# Patient Record
Sex: Male | Born: 1955 | Race: White | Hispanic: No | Marital: Married | Smoking: Former smoker
Health system: Southern US, Community
[De-identification: ages and names within clinical notes are randomized; demographics above are authoritative.]

## PROBLEM LIST (undated history)

## (undated) DIAGNOSIS — I209 Angina pectoris, unspecified: Secondary | ICD-10-CM

## (undated) DIAGNOSIS — F419 Anxiety disorder, unspecified: Secondary | ICD-10-CM

## (undated) DIAGNOSIS — Z21 Asymptomatic human immunodeficiency virus [HIV] infection status: Secondary | ICD-10-CM

## (undated) DIAGNOSIS — B20 Human immunodeficiency virus [HIV] disease: Secondary | ICD-10-CM

## (undated) DIAGNOSIS — A6002 Herpesviral infection of other male genital organs: Secondary | ICD-10-CM

## (undated) DIAGNOSIS — Z860100 Personal history of colon polyps, unspecified: Secondary | ICD-10-CM

## (undated) DIAGNOSIS — E7800 Pure hypercholesterolemia, unspecified: Secondary | ICD-10-CM

## (undated) DIAGNOSIS — J309 Allergic rhinitis, unspecified: Secondary | ICD-10-CM

## (undated) DIAGNOSIS — K5792 Diverticulitis of intestine, part unspecified, without perforation or abscess without bleeding: Secondary | ICD-10-CM

## (undated) DIAGNOSIS — E785 Hyperlipidemia, unspecified: Secondary | ICD-10-CM

## (undated) DIAGNOSIS — G629 Polyneuropathy, unspecified: Secondary | ICD-10-CM

## (undated) DIAGNOSIS — Z9889 Other specified postprocedural states: Secondary | ICD-10-CM

## (undated) DIAGNOSIS — I10 Essential (primary) hypertension: Secondary | ICD-10-CM

## (undated) DIAGNOSIS — G2581 Restless legs syndrome: Secondary | ICD-10-CM

## (undated) HISTORY — DX: Personal history of colonic polyps: Z86.010

## (undated) HISTORY — DX: Anxiety disorder, unspecified: F41.9

## (undated) HISTORY — DX: Hyperlipidemia, unspecified: E78.5

## (undated) HISTORY — DX: Pure hypercholesterolemia, unspecified: E78.00

## (undated) HISTORY — DX: Angina pectoris, unspecified: I20.9

## (undated) HISTORY — DX: Diverticulitis of intestine, part unspecified, without perforation or abscess without bleeding: K57.92

## (undated) HISTORY — DX: Herpesviral infection of other male genital organs: A60.02

## (undated) HISTORY — DX: Personal history of colon polyps, unspecified: Z86.0100

## (undated) HISTORY — DX: Allergic rhinitis, unspecified: J30.9

## (undated) HISTORY — DX: Human immunodeficiency virus (HIV) disease: B20

## (undated) HISTORY — DX: Restless legs syndrome: G25.81

## (undated) HISTORY — DX: Polyneuropathy, unspecified: G62.9

## (undated) HISTORY — DX: Other specified postprocedural states: Z98.890

## (undated) HISTORY — DX: Essential (primary) hypertension: I10

## (undated) HISTORY — DX: Asymptomatic human immunodeficiency virus (hiv) infection status: Z21

---

## 1998-06-16 ENCOUNTER — Emergency Department (HOSPITAL_COMMUNITY): Admission: EM | Admit: 1998-06-16 | Discharge: 1998-06-16 | Payer: Self-pay | Admitting: Emergency Medicine

## 2002-01-27 HISTORY — PX: OTHER SURGICAL HISTORY: SHX169

## 2003-09-07 ENCOUNTER — Ambulatory Visit (HOSPITAL_BASED_OUTPATIENT_CLINIC_OR_DEPARTMENT_OTHER): Admission: RE | Admit: 2003-09-07 | Discharge: 2003-09-07 | Payer: Self-pay | Admitting: Internal Medicine

## 2004-01-15 ENCOUNTER — Ambulatory Visit: Payer: Self-pay | Admitting: Internal Medicine

## 2004-03-19 ENCOUNTER — Ambulatory Visit: Payer: Self-pay | Admitting: Internal Medicine

## 2004-08-12 ENCOUNTER — Ambulatory Visit: Payer: Self-pay | Admitting: Endocrinology

## 2005-08-05 ENCOUNTER — Ambulatory Visit: Payer: Self-pay | Admitting: Family Medicine

## 2005-08-21 ENCOUNTER — Ambulatory Visit: Payer: Self-pay | Admitting: Family Medicine

## 2005-08-25 ENCOUNTER — Encounter: Admission: RE | Admit: 2005-08-25 | Discharge: 2005-08-25 | Payer: Self-pay | Admitting: Family Medicine

## 2005-08-28 ENCOUNTER — Ambulatory Visit: Payer: Self-pay | Admitting: Family Medicine

## 2005-09-09 ENCOUNTER — Ambulatory Visit: Payer: Self-pay | Admitting: Internal Medicine

## 2005-10-20 ENCOUNTER — Ambulatory Visit: Payer: Self-pay | Admitting: Internal Medicine

## 2006-01-06 ENCOUNTER — Ambulatory Visit: Payer: Self-pay | Admitting: Family Medicine

## 2006-02-27 ENCOUNTER — Ambulatory Visit: Payer: Self-pay | Admitting: Family Medicine

## 2006-03-06 ENCOUNTER — Ambulatory Visit: Payer: Self-pay | Admitting: Family Medicine

## 2006-08-17 ENCOUNTER — Ambulatory Visit: Payer: Self-pay | Admitting: Family Medicine

## 2006-09-15 ENCOUNTER — Ambulatory Visit: Payer: Self-pay | Admitting: Family Medicine

## 2007-06-10 ENCOUNTER — Ambulatory Visit: Payer: Self-pay | Admitting: Family Medicine

## 2007-09-06 ENCOUNTER — Ambulatory Visit: Payer: Self-pay | Admitting: Family Medicine

## 2007-10-23 ENCOUNTER — Emergency Department (HOSPITAL_COMMUNITY): Admission: EM | Admit: 2007-10-23 | Discharge: 2007-10-23 | Payer: Self-pay | Admitting: Emergency Medicine

## 2007-10-26 ENCOUNTER — Ambulatory Visit: Payer: Self-pay | Admitting: Family Medicine

## 2007-11-16 ENCOUNTER — Ambulatory Visit: Payer: Self-pay | Admitting: Family Medicine

## 2007-12-13 ENCOUNTER — Ambulatory Visit: Payer: Self-pay | Admitting: Family Medicine

## 2007-12-20 ENCOUNTER — Ambulatory Visit: Payer: Self-pay | Admitting: Family Medicine

## 2007-12-27 ENCOUNTER — Ambulatory Visit: Payer: Self-pay | Admitting: Family Medicine

## 2007-12-29 ENCOUNTER — Ambulatory Visit: Payer: Self-pay | Admitting: Internal Medicine

## 2007-12-29 ENCOUNTER — Ambulatory Visit: Payer: Self-pay | Admitting: Family Medicine

## 2008-01-06 ENCOUNTER — Ambulatory Visit: Payer: Self-pay | Admitting: Family Medicine

## 2008-03-01 ENCOUNTER — Ambulatory Visit: Payer: Self-pay | Admitting: Family Medicine

## 2008-05-29 ENCOUNTER — Ambulatory Visit: Payer: Self-pay | Admitting: Family Medicine

## 2008-06-27 ENCOUNTER — Ambulatory Visit: Payer: Self-pay | Admitting: Family Medicine

## 2008-07-24 ENCOUNTER — Ambulatory Visit: Payer: Self-pay | Admitting: Family Medicine

## 2008-08-24 ENCOUNTER — Ambulatory Visit: Payer: Self-pay | Admitting: Family Medicine

## 2008-09-04 ENCOUNTER — Ambulatory Visit: Payer: Self-pay | Admitting: Family Medicine

## 2008-10-17 ENCOUNTER — Ambulatory Visit: Payer: Self-pay | Admitting: Family Medicine

## 2008-10-19 ENCOUNTER — Ambulatory Visit: Payer: Self-pay | Admitting: Family Medicine

## 2008-10-30 ENCOUNTER — Ambulatory Visit: Payer: Self-pay | Admitting: Family Medicine

## 2008-10-31 ENCOUNTER — Emergency Department (HOSPITAL_COMMUNITY): Admission: EM | Admit: 2008-10-31 | Discharge: 2008-10-31 | Payer: Self-pay | Admitting: Emergency Medicine

## 2009-02-05 ENCOUNTER — Ambulatory Visit: Payer: Self-pay | Admitting: Family Medicine

## 2009-03-09 ENCOUNTER — Ambulatory Visit: Payer: Self-pay | Admitting: Family Medicine

## 2009-05-08 ENCOUNTER — Ambulatory Visit: Payer: Self-pay | Admitting: Family Medicine

## 2009-06-04 ENCOUNTER — Ambulatory Visit: Payer: Self-pay | Admitting: Family Medicine

## 2009-08-06 ENCOUNTER — Ambulatory Visit: Payer: Self-pay | Admitting: Family Medicine

## 2009-09-07 ENCOUNTER — Ambulatory Visit: Payer: Self-pay | Admitting: Family Medicine

## 2009-09-11 ENCOUNTER — Encounter (INDEPENDENT_AMBULATORY_CARE_PROVIDER_SITE_OTHER): Payer: Self-pay | Admitting: *Deleted

## 2009-10-04 ENCOUNTER — Ambulatory Visit (HOSPITAL_COMMUNITY)
Admission: RE | Admit: 2009-10-04 | Discharge: 2009-10-04 | Payer: Self-pay | Source: Home / Self Care | Admitting: Family Medicine

## 2009-10-04 ENCOUNTER — Ambulatory Visit: Payer: Self-pay | Admitting: Family Medicine

## 2009-10-05 ENCOUNTER — Ambulatory Visit: Payer: Self-pay | Admitting: Family Medicine

## 2009-10-09 ENCOUNTER — Encounter (INDEPENDENT_AMBULATORY_CARE_PROVIDER_SITE_OTHER): Payer: Self-pay | Admitting: *Deleted

## 2009-10-10 ENCOUNTER — Ambulatory Visit: Payer: Self-pay | Admitting: Internal Medicine

## 2009-11-16 ENCOUNTER — Ambulatory Visit: Payer: Self-pay | Admitting: Family Medicine

## 2009-11-26 HISTORY — PX: COLONOSCOPY: SHX174

## 2009-12-25 ENCOUNTER — Ambulatory Visit: Payer: Self-pay | Admitting: Family Medicine

## 2009-12-25 ENCOUNTER — Encounter: Admission: RE | Admit: 2009-12-25 | Discharge: 2009-12-25 | Payer: Self-pay | Admitting: Family Medicine

## 2010-01-01 ENCOUNTER — Ambulatory Visit: Payer: Self-pay | Admitting: Family Medicine

## 2010-01-10 ENCOUNTER — Ambulatory Visit: Payer: Self-pay | Admitting: Family Medicine

## 2010-01-11 ENCOUNTER — Encounter
Admission: RE | Admit: 2010-01-11 | Discharge: 2010-01-11 | Payer: Self-pay | Source: Home / Self Care | Attending: Family Medicine | Admitting: Family Medicine

## 2010-01-12 ENCOUNTER — Encounter
Admission: RE | Admit: 2010-01-12 | Discharge: 2010-01-12 | Payer: Self-pay | Source: Home / Self Care | Attending: Family Medicine | Admitting: Family Medicine

## 2010-02-26 NOTE — Miscellaneous (Signed)
Summary: [facility] Previsit/prep  Clinical Lists Changes  Medications: Added new medication of MOVIPREP 100 GM  SOLR (PEG-KCL-NACL-NASULF-NA ASC-C) As per prep instructions. - Signed Rx of MOVIPREP 100 GM  SOLR (PEG-KCL-NACL-NASULF-NA ASC-C) As per prep instructions.;  #1 x 0;  Signed;  Entered by: Wyona Almas RN;  Authorized by: Hilarie Fredrickson MD;  Method used: Electronically to [facility]  [phone]*, [address] [place], [place], [place]  [address], Ph: [phone] or [phone], Fax: [phone] Observations: Added new observation of ALLERGY REV: Done (10/10/2009 13:19)    Prescriptions: MOVIPREP 100 GM  SOLR (PEG-KCL-NACL-NASULF-NA ASC-C) As per prep instructions.  #1 x 0   Entered by:   Wyona Almas RN   Authorized by:   Hilarie Fredrickson MD   Signed by:   Wyona Almas RN on 10/10/2009   Method used:   Electronically to        [facility]  [phone]* (retail)       [address]       [place], [place]  [address]       Ph: [phone] or [phone]       Fax: [phone]   RxID:   [id number]

## 2010-02-26 NOTE — Letter (Signed)
Summary: Pre Visit Letter Revised  [facility]  [address] [place], [place] [address]   Phone: [phone]  Fax: [phone]    09/11/2009 MRN: [id number]  Joseph Johnston [address] [place], [place]  [address]              Procedure Date:  10-24-09    Welcome to the Gastroenterology Division at [facility].    You are scheduled to see a nurse for your pre-procedure visit on 10-10-09 at 1:30p.m. on the 3rd floor at [facility], [address]  We ask that you try to arrive at our office 15 minutes prior to your appointment time to allow for check-in.  Please take a minute to review the attached form.  If you answer "Yes" to one or more of the questions on the first page, we ask that you call the person listed at your earliest opportunity.  If you answer "No" to all of the questions, please complete the rest of the form and bring it to your appointment.    Your nurse visit will consist of discussing your medical and surgical history, your immediate family medical history, and your medications.    If you are unable to list all of your medications on the form, please bring the medication bottles to your appointment and we will list them.  We will need to be aware of both prescribed and over the counter drugs.  We will need to know exact dosage information as well.    Please be prepared to read and sign documents such as consent forms, a financial agreement, and acknowledgement forms.  If necessary, and with your consent, a friend or relative is welcome to sit-in on the nurse visit with you.  Please bring your insurance card so that we may make a copy of it.  If your insurance requires a referral to see a specialist, please bring your referral form from your primary care physician.  No co-pay is required for this nurse visit.     If you cannot keep your appointment, please call [phone] to cancel or reschedule prior to your appointment date.  This allows  [place] the opportunity to schedule an appointment for another patient in need of care.   Thank you for choosing [facility] for your medical needs.  We appreciate the opportunity to care for you.  Please visit [place] at our website  to learn more about our practice.                     Sincerely,  The Gastroenterology Division

## 2010-02-26 NOTE — Letter (Signed)
Summary: [facility] Instructions  Joseph Johnston  [address] [place], [place] [address]   Phone: [phone]  Fax: [phone]       Joseph Johnston    May 24, 1955    MRN: [id number]        Procedure Day Dorna Bloom:  [facility]  10/24/09     Arrival Time:  10:30AM     Procedure Time:  11:30AM     Location of Procedure:                    _ X_  [facility] (4th Floor)                       PREPARATION FOR COLONOSCOPY WITH MOVIPREP   Starting 5 days prior to your procedure 10/19/09 do not eat nuts, seeds, popcorn, corn, beans, peas,  salads, or any raw vegetables.  Do not take any fiber supplements (e.g. Metamucil, Citrucel, and Benefiber).  THE DAY BEFORE YOUR PROCEDURE         DATE: 10/23/09  DAY: TUESDAY  1.  Drink clear liquids the entire day-NO SOLID FOOD  2.  Do not drink anything colored red or purple.  Avoid juices with pulp.  No orange juice.  3.  Drink at least 64 oz. (8 glasses) of fluid/clear liquids during the day to prevent dehydration and help the prep work efficiently.  CLEAR LIQUIDS INCLUDE: Water Jello Ice Popsicles Tea (sugar ok, no milk/cream) Powdered fruit flavored drinks Coffee (sugar ok, no milk/cream) Gatorade Juice: apple, white grape, white cranberry  Lemonade Clear bullion, consomm, broth Carbonated beverages (any kind) Strained chicken noodle soup Hard Candy                             4.  In the morning, mix first dose of MoviPrep solution:    Empty 1 Pouch A and 1 Pouch B into the disposable container    Add lukewarm drinking water to the top line of the container. Mix to dissolve    Refrigerate (mixed solution should be used within 24 hrs)  5.  Begin drinking the prep at 5:00 p.m. The MoviPrep container is divided by 4 marks.   Every 15 minutes drink the solution down to the next mark (approximately 8 oz) until the full liter is complete.   6.  Follow completed prep with 16 oz of clear liquid of your choice  (Nothing red or purple).  Continue to drink clear liquids until bedtime.  7.  Before going to bed, mix second dose of MoviPrep solution:    Empty 1 Pouch A and 1 Pouch B into the disposable container    Add lukewarm drinking water to the top line of the container. Mix to dissolve    Refrigerate  THE DAY OF YOUR PROCEDURE      DATE: 10/24/09  DAY: WEDNESDAY  Beginning at 6:30AM (5 hours before procedure):         1. Every 15 minutes, drink the solution down to the next mark (approx 8 oz) until the full liter is complete.  2. Follow completed prep with 16 oz. of clear liquid of your choice.    3. You may drink clear liquids until 9:30AM (2 HOURS BEFORE PROCEDURE).   MEDICATION INSTRUCTIONS  Unless otherwise instructed, you should take regular prescription medications with a small sip of water   as early as possible the morning of  your procedure.          OTHER INSTRUCTIONS  You will need a responsible adult at least 53 years of age to accompany you and drive you home.   This person must remain in the waiting room during your procedure.  Wear loose fitting clothing that is easily removed.  Leave jewelry and other valuables at home.  However, you may wish to bring a book to read or  an iPod/MP3 player to listen to music as you wait for your procedure to start.  Remove all body piercing jewelry and leave at home.  Total time from sign-in until discharge is approximately 2-3 hours.  You should go home directly after your procedure and rest.  You can resume normal activities the  day after your procedure.  The day of your procedure you should not:   Drive   Make legal decisions   Operate machinery   Drink alcohol   Return to work  You will receive specific instructions about eating, activities and medications before you leave.    The above instructions have been reviewed and explained to me by   Wyona Almas RN  October 10, 2009 1:57 PM     I fully  understand and can verbalize these instructions _____________________________ Date _________

## 2010-03-21 ENCOUNTER — Ambulatory Visit (INDEPENDENT_AMBULATORY_CARE_PROVIDER_SITE_OTHER): Payer: BC Managed Care – PPO | Admitting: Family Medicine

## 2010-03-21 DIAGNOSIS — K219 Gastro-esophageal reflux disease without esophagitis: Secondary | ICD-10-CM

## 2010-03-21 DIAGNOSIS — M25519 Pain in unspecified shoulder: Secondary | ICD-10-CM

## 2010-04-08 ENCOUNTER — Ambulatory Visit (INDEPENDENT_AMBULATORY_CARE_PROVIDER_SITE_OTHER): Payer: BC Managed Care – PPO | Admitting: Family Medicine

## 2010-04-08 DIAGNOSIS — K120 Recurrent oral aphthae: Secondary | ICD-10-CM

## 2010-04-08 DIAGNOSIS — K219 Gastro-esophageal reflux disease without esophagitis: Secondary | ICD-10-CM

## 2010-04-08 DIAGNOSIS — R319 Hematuria, unspecified: Secondary | ICD-10-CM

## 2010-05-02 LAB — SEDIMENTATION RATE: Sed Rate: 4 mm/hr (ref 0–16)

## 2010-05-02 LAB — GC/CHLAMYDIA PROBE AMP, URINE
Chlamydia, Swab/Urine, PCR: NEGATIVE
GC Probe Amp, Urine: NEGATIVE

## 2010-05-02 LAB — EHRLICHIA ANTIBODY PANEL: E chaffeensis (HGE) Ab, IgM: <1:20 {titer}

## 2010-05-02 LAB — B. BURGDORFI ANTIBODIES: B burgdorferi Ab IgG+IgM: 0.07 {ISR}

## 2010-06-05 ENCOUNTER — Ambulatory Visit: Payer: BC Managed Care – PPO | Admitting: Family Medicine

## 2010-06-05 ENCOUNTER — Encounter: Payer: Self-pay | Admitting: Family Medicine

## 2010-06-05 ENCOUNTER — Ambulatory Visit (INDEPENDENT_AMBULATORY_CARE_PROVIDER_SITE_OTHER): Payer: BC Managed Care – PPO | Admitting: Family Medicine

## 2010-06-05 VITALS — BP 130/80 | HR 85 | Wt 220.0 lb

## 2010-06-05 DIAGNOSIS — N390 Urinary tract infection, site not specified: Secondary | ICD-10-CM

## 2010-06-05 LAB — POCT URINALYSIS DIPSTICK
Bilirubin, UA: NEGATIVE
Glucose, UA: NEGATIVE
Leukocytes, UA: NEGATIVE
Nitrite, UA: NEGATIVE

## 2010-06-05 MED ORDER — ROPINIROLE HCL 0.25 MG PO TABS: 0.2500 mg | ORAL_TABLET | Freq: Every day | ORAL | Status: DC

## 2010-06-05 MED ORDER — SULFAMETHOXAZOLE-TMP DS 800-160 MG PO TABS: 1.0000 | ORAL_TABLET | Freq: Two times a day (BID) | ORAL | Status: AC

## 2010-06-05 NOTE — Progress Notes (Signed)
  Subjective:    Patient ID: Joseph Johnston, male    DOB: 1955-10-11, 55 y.o.   MRN: [id number]  HPI he does state that the Nexium did get rid of his reflux symptoms however he has continued to have difficulty with abdominal bloating and gas like sensation. He cannot relate this to any particular foods. He's had no difficulty with nausea, vomiting, diarrhea. He does note that the gurgling and gas sensation gets worse with stress He also complains of a one-week history of discomfort in the perineal area and difficulty with urinating. He states that he gets started and then has to stop halfway through. No discharge or dysuria. Sexual activity was 3 or 4 weeks ago with his same sexual partner. He also notes that he has had more difficulty with RLS symptoms and would like to be placed on medication for that.   Review of Systems     Objective:   Physical Exam Abdominal exam shows decreased bowel sounds without masses or tenderness. Genitalia normal. Rectal exam shows a tender boggy prostate reproduces symptoms       Assessment & Plan:  Prostatitis. Probable IBS. RLS I will place him on Septra. I will also give Requip which she has had in the past.

## 2010-06-05 NOTE — Patient Instructions (Signed)
Take the antibiotic until it is done. If further difficulty please call. Let me know how the Requip works.

## 2010-06-14 NOTE — Procedures (Signed)
NAME:  Joseph Johnston, Joseph Johnston NO.:  [id number]   MEDICAL RECORD NO.:  [id number]          PATIENT TYPE:  OUT   LOCATION:  SLEEP CENTER                 FACILITY:  [facility]   PHYSICIAN:  Marcelyn Bruins, M.D. [facility] DATE OF BIRTH:  [birth year]   DATE OF ADMISSION:  09/07/2003  DATE OF DISCHARGE:  09/07/2003                              NOCTURNAL POLYSOMNOGRAM   REFERRING PHYSICIAN:  Corwin Levins, M.D.   INDICATION FOR THE STUDY:  Hypersomnia with sleep apnea.   SLEEP ARCHITECTURE:  The patient had a total sleep time of 362 minutes with  decreased REM and no slow wave sleep.  Sleep onset latency was mildly  prolonged at 32 minutes.  REM latency was quite prolonged at 273 minutes.   IMPRESSION:  1. Very mild obstructive sleep apnea hypopnea syndrome with oxygen     desaturation only as low 94%.  His obstructive events were positional,     nor were they related to REM.  2. Snoring noted throughout the study but was not quantified.  3. Premature ventricular contractions noted with a fair amount of frequency     during the study.  4. Large numbers of leg jerks with significant sleep disruption.  Clinical     correlation is suggested.                                   ______________________________                                Marcelyn Bruins, M.D. [facility]     KC/MEDQ  D:  09/19/2003 11:42:08  T:  09/20/2003 12:27:42  Job:  [id number]   cc:   Corwin Levins, M.D. [facility]

## 2010-06-14 NOTE — Assessment & Plan Note (Signed)
HEALTHCARE                               PULMONARY OFFICE NOTE   JEREMIYAH, CULLENS                      MRN:          [id number]  DATE:10/20/2005                            DOB:          [birth year]    PULMONARY FINAL FOLLOWUP:   HISTORY OF PRESENT ILLNESS:  This is a 55 year old white male, former  smoker, in for follow up evaluation of PFT'S and chest x-ray for evaluation  of unexplained dyspnea that occurs when he walks his dog, but not when he  does his treadmill.  He tells me he has not been doing his treadmill lately,  and has never pushed it to the level.  He was actually short of breath while  doing so.  He denies any exertional chest pain, orthopnea or nocturnal  symptoms at all.  He denies any cough, fevers, chills, sweats, hoarseness,  sinus or reflex symptoms.   He states he is still short of breath now walking the dogs, but this is  not really reproducible or directly proportionate to length or intensity of  exercise.   PHYSICAL EXAMINATION:  GENERAL:  He is a pleasant, ambulatory white male in  no acute distress.  VITAL SIGNS:  Stable.  Weight 228 pounds which is up almost 10 pounds from  previous visit (weight was 190 when he quit smoking in 2002).  Vital signs  are unremarkable.  HEENT:  Unremarkable.  Pharynx is clear.  LUNGS:  Lung fields perfectly clear bilaterally to auscultation and  percussion.  HEART:  Regular rhythm without murmurs, gallops, rubs.  ABDOMEN:  Soft, benign.  EXTREMITIES:  Warm without calf tenderness, cyanosis, clubbing or edema.   STUDIES:  Hemoglobin saturation 97% on room air.  Chest x-ray's and PFT's  were essentially normal.   IMPRESSION:  No evidence of an obvious pulmonary abnormality that would  explain paroxysms of dyspnea that are not directly related to activity.  The  only significant finding is one of progressive weight gain since he stopped  smoking to a level of 32 pounds extra now,  suggesting a possibility of  deconditioning.  That, and the fact that he says that even when he did the  treadmill he was really not working out aerobically indicated to me the  likelihood is that he has developed a combination of deconditioning and  anxiety related to his exercise tolerance.  To sort through the differential I therefore spent extra time with the  patient and going over the test that he had and recommended the following  approach:  Exercise level until he is short of breath but not out of breath,  30 minutes daily x2 weeks on the treadmill by adjusting the speed and  inclination.  If not convinced that he is having a significant training  benefit, the next step would be a CVST for which I have given the phone  number to schedule at his convenience.  Charlaine Dalton. Sherene Sires, MD, [facility]   MBW/MedQ  DD:  10/20/2005  DT:  10/22/2005  Job #:  [id number]   cc:   Sharlot Gowda, M.D.

## 2010-06-14 NOTE — Assessment & Plan Note (Signed)
[facility]                               PULMONARY OFFICE NOTE   Joseph Johnston, Joseph Johnston                      MRN:          [id number]  DATE:09/09/2005                            DOB:          1956-01-19    This is a pulmonary consultation requested by Dr. Susann Givens.   REASON FOR CONSULTATION:  Dyspnea.   HISTORY:  A 55 year old white male, who quit smoking in 2002 with no  respiratory complaints, then at a weight of 190 pounds.  He progressively  gained weight, up to about a weight of 250, with the onset of dyspnea within  the last two years, and now is losing weight voluntarily down to a weight of  212, but not getting better in terms of his dyspnea, which is now occurring  daily, not reproducible with activity but typically present at rest.  Despite complaints of dyspnea, he is able to get on a treadmill, and work  in the yard all day, but on the other hand gets short of breath just while  walking with his sister (apparently his sister is a trained athlete who does  marathons).   The patient denies any difficulty with sleeping, an associated cough, chest  pain, fevers, chills, sweats, orthopnea, PND, leg swelling or response to  beta agonists, which he says just make him feel shaky but don't help his  breathing.   PAST MEDICAL HISTORY:  Significant for hyperlipidemia.   ALLERGIES:  AMOXICILLIN causes rash.   SOCIAL HISTORY:  He quit smoking in 2002.   MEDICATIONS:  Include vitamins only.   FAMILY HISTORY:  Significant for emphysema in his father, who was recently  diagnosed with lung cancer and is a smoker.   REVIEW OF SYSTEMS:  Taken in detail on the work sheet, and significant for  the problems as outlined above.   PHYSICAL EXAMINATION:  GENERAL:  This is an anxious white male with typical  sigh of respirations, in no acute distress.  VITAL SIGNS:  Afebrile, normal vital signs.  HEENT:  Unremarkable.  Oropharynx is clear.  NECK:   Supple without cervical adenopathy or tenderness.  The trachea was  midline, no thyromegaly.  LUNGS:  Lung fields reveal diminished breath sounds bilaterally, no  wheezing.  CARDIAC:  There is a regular rhythm without murmur, gallop or rub.  No  increase in P2.  ABDOMEN:  Soft, benign.  EXTREMITIES:  Warm without calf tenderness, cyanosis, clubbing or edema.   LABORATORY DATA:  From Dr. Jola Babinski office included a bicarb level of 25,  normal TSH, normal CBC.   Chest x-ray is reported to be normal recently, but not available.   IMPRESSION:  Paroxysms of dyspnea that do not necessarily directly relate to  activity, except for the fact that he is short of breath when he walks with  his sister.  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  Most  likely this is functional in nature, but with asthma and chronic obstructive  pulmonary  disease being much less likely.   I have reviewed with the patient the concept of a manual versus automatic  respiratory rate control center, and asked him to try to stop focusing on  his breathing, then reassured him that if his exercise tolerance is good  with yard work and aerobic training on a treadmill, that he should be fine,  but that there should not be any serious COPD, or for that matter other lung  disease related to smoking (especially lung cancer, since his father has  just recently been diagnosed).  Hopefully this will reassure him.   In the meantime, I have scheduled him for a full set of PFTs, and I would  consider a CPST if the diagnosis remains in doubt, plus perhaps a  methacholine challenge test to be complete, but I do not believe either of  these will be necessary.                                   Charlaine Dalton. Sherene Sires, MD, [facility]   MBW/MedQ  DD:  09/09/2005  DT:  09/09/2005  Job #:  [id number]   cc:   Sharlot Gowda, MD

## 2010-09-13 ENCOUNTER — Telehealth: Payer: Self-pay | Admitting: Family Medicine

## 2010-09-13 MED ORDER — ALLOPURINOL 100 MG PO TABS: 100.0000 mg | ORAL_TABLET | Freq: Every day | ORAL | Status: DC

## 2010-09-13 NOTE — Telephone Encounter (Signed)
His insurance will not pay for urologist. He would like to be switched back to allopurinol. I will start him out at 100 mg and have him call me in one month. Cautioned him on gout attacks possibly occurring.

## 2010-09-13 NOTE — Telephone Encounter (Signed)
On uloric wants to switch to allopurinol for his gout  Please call pt

## 2010-09-18 ENCOUNTER — Other Ambulatory Visit: Payer: Self-pay

## 2010-09-18 MED ORDER — ALLOPURINOL 100 MG PO TABS: 100.0000 mg | ORAL_TABLET | Freq: Every day | ORAL | Status: DC

## 2010-09-18 NOTE — Telephone Encounter (Signed)
Pt called and said allopurinol didn't go through

## 2010-11-28 ENCOUNTER — Encounter: Payer: Self-pay | Admitting: Family Medicine

## 2010-11-28 ENCOUNTER — Ambulatory Visit (INDEPENDENT_AMBULATORY_CARE_PROVIDER_SITE_OTHER): Payer: BC Managed Care – PPO | Admitting: Family Medicine

## 2010-11-28 VITALS — BP 118/80 | HR 62 | Wt 221.0 lb

## 2010-11-28 DIAGNOSIS — Z79899 Other long term (current) drug therapy: Secondary | ICD-10-CM

## 2010-11-28 DIAGNOSIS — G2581 Restless legs syndrome: Secondary | ICD-10-CM

## 2010-11-28 DIAGNOSIS — Z8719 Personal history of other diseases of the digestive system: Secondary | ICD-10-CM

## 2010-11-28 DIAGNOSIS — G4730 Sleep apnea, unspecified: Secondary | ICD-10-CM

## 2010-11-28 DIAGNOSIS — Z209 Contact with and (suspected) exposure to unspecified communicable disease: Secondary | ICD-10-CM

## 2010-11-28 DIAGNOSIS — N400 Enlarged prostate without lower urinary tract symptoms: Secondary | ICD-10-CM

## 2010-11-28 LAB — CBC WITH DIFFERENTIAL/PLATELET
Basophils Relative: 0 % (ref 0–1)
Hemoglobin: 15.9 g/dL (ref 13.0–17.0)
Lymphs Abs: 4.4 10*3/uL — ABNORMAL HIGH (ref 0.7–4.0)
Monocytes Relative: 5 % (ref 3–12)
Neutro Abs: 4.4 10*3/uL (ref 1.7–7.7)
Neutrophils Relative %: 46 % (ref 43–77)
Platelets: 333 10*3/uL (ref 150–400)
RBC: 4.87 MIL/uL (ref 4.22–5.81)

## 2010-11-28 MED ORDER — TERAZOSIN HCL 1 MG PO CAPS: 1.0000 mg | ORAL_CAPSULE | Freq: Every day | ORAL | Status: DC

## 2010-11-28 NOTE — Progress Notes (Signed)
  Subjective:    Patient ID: Joseph Johnston, male    DOB: 06-Sep-1955, 55 y.o.   MRN: [id number]  HPI He is here for consult concerning difficulty with prostate. He is now noted difficulty with incomplete emptying and decreased stream. He had been placed on Hytrin in the past which helped however he stopped the medication thinking he did not need to continue this. He also has a history of ulcerative colitis and recently had a colonoscopy which did show an ulcerated lesion. He is on medication for this. He does have history of sleep apnea but is not interested in being on CPAP. He does have RLS and does occasionally use Requip.   Review of Systems     Objective:   Physical Exam Alert and in no distress otherwise not examined       Assessment & Plan:  BPH. Ulcerative colitis. Sleep apnea. RLS.  I will place him back on Hytrin. Also discusses ulcers colitis and appropriate followup on that. He will call me if he has any troubles. I recommended that he call me prior to having another colonoscopy which he states is supposed to be done in one year.

## 2010-11-29 LAB — COMPREHENSIVE METABOLIC PANEL
ALT: 27 U/L (ref 0–53)
Albumin: 5.2 g/dL (ref 3.5–5.2)
Alkaline Phosphatase: 75 U/L (ref 39–117)
CO2: 23 mEq/L (ref 19–32)
Glucose, Bld: 95 mg/dL (ref 70–99)
Potassium: 4.4 mEq/L (ref 3.5–5.3)
Sodium: 142 mEq/L (ref 135–145)
Total Protein: 8.1 g/dL (ref 6.0–8.3)

## 2010-11-29 LAB — LIPID PANEL
LDL Cholesterol: 187 mg/dL — ABNORMAL HIGH (ref 0–99)
Triglycerides: 198 mg/dL — ABNORMAL HIGH (ref <150)
VLDL: 40 mg/dL (ref 0–40)

## 2011-01-20 ENCOUNTER — Ambulatory Visit (INDEPENDENT_AMBULATORY_CARE_PROVIDER_SITE_OTHER): Payer: BC Managed Care – PPO | Admitting: Family Medicine

## 2011-01-20 ENCOUNTER — Encounter: Payer: Self-pay | Admitting: Family Medicine

## 2011-01-20 DIAGNOSIS — J019 Acute sinusitis, unspecified: Secondary | ICD-10-CM

## 2011-01-20 DIAGNOSIS — R509 Fever, unspecified: Secondary | ICD-10-CM

## 2011-01-20 DIAGNOSIS — R079 Chest pain, unspecified: Secondary | ICD-10-CM

## 2011-01-20 LAB — CBC WITH DIFFERENTIAL/PLATELET
Eosinophils Absolute: 0 10*3/uL (ref 0.0–0.7)
Hemoglobin: 15.7 g/dL (ref 13.0–17.0)
Lymphocytes Relative: 43 % (ref 12–46)
Lymphs Abs: 1.1 10*3/uL (ref 0.7–4.0)
MCH: 31.4 pg (ref 26.0–34.0)
Monocytes Relative: 7 % (ref 3–12)
Neutro Abs: 1.3 10*3/uL — ABNORMAL LOW (ref 1.7–7.7)
Neutrophils Relative %: 50 % (ref 43–77)
Platelets: 127 10*3/uL — ABNORMAL LOW (ref 150–400)
RBC: 5 MIL/uL (ref 4.22–5.81)
WBC: 2.6 10*3/uL — ABNORMAL LOW (ref 4.0–10.5)

## 2011-01-20 LAB — HIV ANTIBODY (ROUTINE TESTING W REFLEX): HIV: REACTIVE

## 2011-01-20 MED ORDER — AZITHROMYCIN 250 MG PO TABS: ORAL_TABLET | ORAL | Status: DC

## 2011-01-20 MED ORDER — ESOMEPRAZOLE MAGNESIUM 40 MG PO CPDR: 40.0000 mg | DELAYED_RELEASE_CAPSULE | Freq: Every day | ORAL | Status: DC

## 2011-01-20 NOTE — Progress Notes (Signed)
Last week started with having a "hard time breathing"--has discomfort/tightness in his chest, described as a knot that is twisting.  When he leans forward, it feels like reflux, like coming up chest.  Last week he only noticed these symptoms when leaning forward, but now it is constant.  There is associated nausea, but no vomiting.  Nausea is worse today.  Has been using Tums with temporary relief.    +discomfort in throat like post-nasal drip, causing very slight cough.  Yesterday had some head and sinus congestion, relieved by Tylenol Cold. +pain behind his eyes. +notes bad breath, even after brushing his teeth. Denies diarrhea. Feels like a lot of phlegm is in back of throat when he lies down.  Feels a little similar to when he was diagnosed with syphillis in the past. But now he is also having chills. Complaining of pain, being very sensitive to the touch all over his body, especially arms and legs, ie-hurts to take off his shirt.  These symptoms began last week, but getting worse.  Having some low back pain, but no other myalgias, but he did have a lot of muscle pain last week (like he was hit by a truck), but much improved now.  Running low grade fevers at home x 2 days (100.1-100.5).  Denies sick contacts.  Denies any exertional chest pain, shortness of breath. Stable monogamous relationship with male partner.  Would like RPR and HIV re-checked  Past Medical History  Diagnosis Date  . Dyslipidemia   . Gout   . Sleep apnea   . 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 Seborrheic dermatitis   . Ulcerative colitis     No past surgical history on file.  History   Social History  . Marital Status: Single    Spouse Name: N/A    Number of Children: N/A  . Years of Education: N/A   Occupational History  . landscaping    Social History Main Topics  . Smoking status: Former Smoker -- 0.0 packs/day for 30 years    Quit date: 01/28/2000  . Smokeless tobacco: Never Used  . Alcohol Use: No  . Drug  Use: No  . Sexually Active: Yes -- Male partner(s)   Other Topics Concern  . Not on file   Social History Narrative  . No narrative on file   Current outpatient prescriptions:allopurinol (ZYLOPRIM) 100 MG tablet, Take 1 tablet (100 mg total) by mouth daily., Disp: 30 tablet, Rfl: 2;  mesalamine (LIALDA) 1.2 G EC tablet, Take 1,200 mg by mouth 2 (two) times daily.  , Disp: , Rfl: ;  rOPINIRole (REQUIP) 0.25 MG tablet, Take 1 tablet (0.25 mg total) by mouth at bedtime., Disp: 30 tablet, Rfl: 12 terazosin (HYTRIN) 1 MG capsule, Take 1 capsule (1 mg total) by mouth at bedtime., Disp: 30 capsule, Rfl: 11  Allergies  Allergen Reactions  . Amoxicillin     REACTION: rash/hives  . Ampicillin     REACTION: Rash/hives  . Hydrocodone     REACTION: rash  . Oxycodone Hcl     REACTION: Rash  . Penicillins     REACTION: rash/hives   ROS:  See HPI. +fever, sinus congestion, chest pain, nausea, slight shortness of breath with leaning forward, +nausea, no diarrhea or skin rash  PHYSICAL EXAM: BP 130/92  Pulse 90  Temp(Src) 99 F (37.2 C) (Oral)  Resp 18  Wt 231 lb (104.781 kg)  SpO2 99% Well developed, pleasant male, in no distress.  Twice during  the visit he got slightly diaphoretic, felt lightheaded and needed to lie down (once during visit, and once after labs drawn) HEENT: PERRL, EOMI, conjunctiva clear.  TM's and EAC's normal.  Nasal mucosa moderately edematous with yellow-brown mucus.  Sinuses (maxillary and frontal) nontender.  OP clear Neck: no lymphadenopathy Heart: regular rate and rhythm without murmur Lungs: somewhat diminished breath sounds posteriorly bilaterally, normal anteriorly Skin: no rash Psych: normal mood, affect, hygiene and grooming  ASSESSMENT/PLAN:  1. Chest pain  PR ELECTROCARDIOGRAM, COMPLETE, DG Chest 2 View  2. Fever  CBC with Differential, RPR, HIV Antibody ( Reflex)  3. Sinusitis acute  azithromycin (ZITHROMAX) 250 MG tablet   History sounds like he may  have had flu, or flu-like illness, but now has evidence of bacterial sinus infection. He is having symptoms of reflux, but also some pleuritic chest pain.  EKG showed sinus bradycardia with incomplete RBBB, no acute abnormalities.  He was encouraged to drink plenty of fluids, continue decongestants, use Mucinex.  Given samples of Nexium to help with reflux symptoms.  He was asked to get CXR (especially if any shortness of breath, ongoing pleuritic chest pain)--orders in computer.  Will check CBC given presyncope, as well as HIV and RPR per patient request.  F/u if symptoms persist, worsen.

## 2011-01-20 NOTE — Patient Instructions (Addendum)
Continue decongestants.  Add Mucinex (or other expectorant) to help with chest congestion. Drink plenty of fluids.  Get chest x-ray if not improving with these measures.  Go to ER if worsening chest discomfort or shortness of breath  Use the Nexium for heartburn.  Try and eat small, frequent meals, and keep the head of bed elevated at night

## 2011-01-21 LAB — RPR

## 2011-01-24 ENCOUNTER — Encounter: Payer: Self-pay | Admitting: Medical

## 2011-01-24 ENCOUNTER — Ambulatory Visit (INDEPENDENT_AMBULATORY_CARE_PROVIDER_SITE_OTHER): Payer: BC Managed Care – PPO | Admitting: Medical

## 2011-01-24 VITALS — BP 120/80 | HR 100 | Temp 98.5°F | Resp 18 | Wt 230.0 lb

## 2011-01-24 DIAGNOSIS — B9789 Other viral agents as the cause of diseases classified elsewhere: Secondary | ICD-10-CM

## 2011-01-24 DIAGNOSIS — B349 Viral infection, unspecified: Secondary | ICD-10-CM | POA: Insufficient documentation

## 2011-01-24 DIAGNOSIS — R519 Headache, unspecified: Secondary | ICD-10-CM | POA: Insufficient documentation

## 2011-01-24 DIAGNOSIS — R0789 Other chest pain: Secondary | ICD-10-CM

## 2011-01-24 DIAGNOSIS — R51 Headache: Secondary | ICD-10-CM

## 2011-01-24 DIAGNOSIS — R52 Pain, unspecified: Secondary | ICD-10-CM

## 2011-01-24 NOTE — Patient Instructions (Signed)
For the headache and chest discomfort, use Ibuprofen OTC 200mg , 4 tablets every 6 hours with food.  Rest in a cool dark quiet room.  If the headache or chest discomfort become worse, call 911 or go to the Emergency Dept.  We will call just as soon as we get the remaining blood test back.    SYMPTOMS OF A HEART ATTACK The most common signs and symptoms include:   Tightness or squeezing in the chest.   Feeling of heaviness on the chest.   Discomfort in the arms, neck, or jaw.   Shortness of breath and nausea.   Cold, wet skin.   Chest pain or discomfort brought on by physical effort or excitement which increase the oxygen needs of the heart.   SEEK IMMEDIATE MEDICAL CARE IF:   You develop nausea, vomiting, or shortness of breath.   You feel faint, lightheaded, or pass out.   Your chest discomfort gets worse.   You are sweating or experience sudden profound fatigue.   Your discomfort lasts longer than 15 minutes.   If you have a history of heart disease or angina, you should tell your caregiver right away about any increase in the severity or frequency of your chest discomfort or angina attacks. When you have angina, you should stop what you are doing and sit down. This may bring relief in 3 to 5 minutes. If your caregiver has prescribed nitro, take it as directed.   WHAT IS A HEART ATTACK: Myocardial Infarction A myocardial infarction (MI) is damage to the heart that is not reversible. It is also called a heart attack. An MI usually occurs when a heart (coronary) artery becomes blocked or narrowed. This cuts off the blood supply to the heart. When one or more of the heart (coronary) arteries becomes blocked, that area of the heart begins to die. This causes pain felt during an MI.  If you think you might be having an MI, call your local emergency services immediately (911 in [place] It is recommended that you take a 162 mg non-enteric coated aspirin if you do not have an aspirin  allergy. Do not drive yourself to the hospital or wait to see if your symptoms go away. The sooner MI is treated, the greater the amount of heart muscle saved. Time is muscle. It can save your life. CAUSES  An MI can occur from:  A gradual buildup of a fatty substance called plaque. When plaque builds up in the arteries, this condition is called atherosclerosis. This buildup can block or reduce the blood supply to the heart artery(s).   A sudden plaque rupture within a heart artery that causes a blood clot (thrombus). A blood clot can block the heart artery which does not allow blood flow to the heart.   A severe tightening (spasm) of the heart artery. This is a less common cause of a heart attack. When a heart artery spasms, it cuts off blood flow through the artery. Spasms can occur in heart arteries that do not have atherosclerosis.  RISK FACTORS People at risk for an MI usually have one or more risk factors, such as:  High blood pressure.   High cholesterol.   Smoking.   Gender. Men have a higher heart attack risk.   Overweight/obesity.   Age.   Family history.   Lack of exercise.   Diabetes.   Stress.   Excessive alcohol use.   Street drug use (cocaine and methamphetamines).    Stroke (Cerebrovascular  Accident)  SIGNS AND SYMPTOMS OF STROKE These symptoms usually develop suddenly (or may be newly present upon awakening from sleep):  Sudden weakness or numbness of the face, arm, or leg, especially on one side of the body.   Sudden confusion.   Trouble speaking (aphasia) or understanding.   Sudden trouble seeing in one or both eyes.   Sudden trouble walking.   Dizziness.   Loss of balance or coordination.   Sudden severe headache with no known cause.   SEEK IMMEDIATE MEDICAL CARE IF:   You have sudden weakness or numbness of the face, arm, or leg, especially on one side of the body.   You have sudden confusion.   You have trouble speaking or  understanding.   You have sudden trouble seeing in one or both eyes.   You have sudden trouble walking.   You have dizziness.   You have a loss of balance or coordination.   You have a sudden severe headache with no known cause.   You have a fever.   You are coughing or have difficulty breathing.   You have new chest pain, angina, or an irregular heartbeat.  ANY OF THESE SYMPTOMS MAY REPRESENT A SERIOUS PROBLEM THAT IS AN EMERGENCY. Do not wait to see if the symptoms will go away. Get medical help right away. Call your local emergency services (911 in [place] DO NOT drive yourself to the hospital.  TREATMENT  TIME IS OF THE ESSENCE! It is important to seek treatment within 4 hours of the start of symptoms because you may receive a "clot dissolving" medication that cannot be given after that time. Even if you don't know when your symptoms began, get treatment as soon as possible.    WHAT IS A STROKE: A stroke is the sudden death of brain tissue. It is a medical emergency. A stroke can cause permanent loss of brain function. This can cause problems with different parts of your body. A TIA (transient ischemic attack) is different because it does not cause permanent damage. A TIA is a short-lived problem of poor blood flow affecting a part of the nervous system. TIA is also a serious problem because having a TIA greatly increases the chances of having a stroke. When symptoms first develop, you cannot know if the problem might be a stroke or TIA. CAUSES  A stroke is caused by a decrease of oxygen supply to an area of your brain. It is usually the result of a small blood clot or the arteries hardening. A stroke can also be caused by blocked or damaged carotid arteries. Bleeding in the brain can cause, or accompany, a stroke.  RISK FACTORS  High blood pressure (hypertension).   High cholesterol.   Diabetes.   Heart disease.   The buildup of fatty deposits in the blood vessels  (peripheral artery disease or atherosclerosis).   An abnormal heart rhythm (atrial fibrillation).   Obesity.   Smoking.   Taking oral contraceptives (especially in combination with smoking).   Physical inactivity.   A diet high in fats, salt (sodium), and calories.   Alcohol use.   Use of illegal drugs (especially cocaine and methamphetamine).   Being a male.   Being an Tree surgeon.   Age over 79.   Family history of stroke.   Previous history of blood clots, a "warning stroke" (transient ischemic attack, TIA), or heart attack.   Sickle cell disease.

## 2011-01-24 NOTE — Progress Notes (Signed)
Subjective:   HPI  Joseph Johnston is a 55 y.o. male who presents with f/u from visit earlier in the week.  He was seen this week for body aches, fever, chest tightness, and was though to have viral syndrome.  Labs were taken.  He is here to f/u on labs.  He still c/o bad headache, pain behind the eyes, chest still feels tight, fatigued, and is no worse or better.  Of note, he is in monogamous relationship with his male partner.  He has hx/o syphilis and says he feels like he did with prior syphilis infection. No prior positive HIV test.  He denies palpitations, no sweats, no vision or hearing changes, no numbness or tingling, no falls, no unilateral weakness.  He denies hx/o heart problems, but father died of CHF.  Father was diagnosed with heart disease in his 58s.  Sister has hx/o CVA in 21s.  No other aggravating or relieving factors.    No other c/o.  The following portions of the patient's history were reviewed and updated as appropriate: allergies, current medications, past family history, past medical history, past social history, past surgical history and problem list.  Past Medical History  Diagnosis Date  . Dyslipidemia   . Gout   . Sleep apnea   . 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 Seborrheic dermatitis   . Ulcerative colitis    Review of Systems Constitutional: -fever, -chills, -sweats, -unexpected -weight change, +fatigue ENT: -runny nose, -ear pain, -sore throat Cardiology:  -chest pain, -palpitations, -edema, +tightness in his chest Respiratory: -cough, +shortness of breath, -wheezing Gastroenterology: -abdominal pain, +nausea, -vomiting, -diarrhea, -constipation  Hematology: -bleeding or bruising problems Musculoskeletal: -arthralgias, +myalgias all over, -joint swelling, +back pain Ophthalmology: -vision changes,+dizzy  Urology: -dysuria, -difficulty urinating, -hematuria, -urinary frequency, -urgency Neurology: +headache, -weakness, -tingling, -numbness    Objective:   Physical Exam  Filed Vitals:   01/24/11 0805  BP: 120/80  Pulse: 100  Temp: 98.5 F (36.9 C)  Resp: 18    General appearance: alert, no distress, WD/WN, ill appearing Skin: slight macular rash on chest diffuse HEENT: normocephalic, sclerae anicteric, PERRLA, EOMi, nares with clear discharge and mild erythema, pharynx normal Oral cavity: MMM, no lesions Neck: supple, no lymphadenopathy, no thyromegaly, no masses Heart: RRR, normal S1, S2, no murmurs Lungs: CTA bilaterally, no wheezes, rhonchi, or rales Abdomen: +bs, soft, non tender, non distended, no masses, no hepatomegaly, no splenomegaly Back: non tender Extremities: no edema, no cyanosis, no clubbing Pulses: 2+ symmetric, upper and lower extremities, normal cap refill Neurological: alert, oriented x 3, CN2-12 intact, strength normal upper extremities and lower extremities, sensation normal throughout, DTRs 2+ throughout, no cerebellar signs, gait normal Psychiatric: normal affect, behavior normal, pleasant    Assessment and Plan :     Encounter Diagnoses  Name Primary?  . Headache Yes  . Chest tightness   . Body aches   . Viral syndrome    Reviewed his labs from earlier in the week.  His WBC were decreased, platelets decreased, RPR negative, but HIV antibody reactive.  We discussed his labs and +HIV antibody.  Western blot is still pending.  CXR with no obvious mass, pneumonia, effusion, or cardiomegaly, no acute changes.  Reviewed EKG from earlier in the week showing sinus bradycardia with incomplete RBBB, no acute abnormalities.  Advised that I can't completely rule out cardiac etiology, but etiology likely still viral.  He will use Ibuprofen for fever and headache, rest, will hydrate well, and if  worse headache or chest symptoms, will call 911 or go to the ED.  We will call with western blot result ASAP.

## 2011-01-28 ENCOUNTER — Other Ambulatory Visit: Payer: Self-pay | Admitting: Family Medicine

## 2011-01-29 LAB — HIV 1/2 CONFIRMATION
HIV-1 antibody: NEGATIVE
HIV-2 Ab: NEGATIVE

## 2011-01-30 ENCOUNTER — Ambulatory Visit (INDEPENDENT_AMBULATORY_CARE_PROVIDER_SITE_OTHER): Payer: BC Managed Care – PPO | Admitting: Family Medicine

## 2011-01-30 DIAGNOSIS — R51 Headache: Secondary | ICD-10-CM

## 2011-01-30 DIAGNOSIS — R0989 Other specified symptoms and signs involving the circulatory and respiratory systems: Secondary | ICD-10-CM

## 2011-01-30 DIAGNOSIS — R52 Pain, unspecified: Secondary | ICD-10-CM

## 2011-01-30 LAB — CBC WITH DIFFERENTIAL/PLATELET
Basophils Absolute: 0.1 10*3/uL (ref 0.0–0.1)
Basophils Relative: 1 % (ref 0–1)
HCT: 43.9 % (ref 39.0–52.0)
Lymphocytes Relative: 50 % — ABNORMAL HIGH (ref 12–46)
MCHC: 35.5 g/dL (ref 30.0–36.0)
Monocytes Absolute: 0.9 10*3/uL (ref 0.1–1.0)
Neutro Abs: 4.1 10*3/uL (ref 1.7–7.7)
Neutrophils Relative %: 40 % — ABNORMAL LOW (ref 43–77)
Platelets: 327 10*3/uL (ref 150–400)
RDW: 12.4 % (ref 11.5–15.5)
WBC: 10.3 10*3/uL (ref 4.0–10.5)

## 2011-01-30 LAB — COMPREHENSIVE METABOLIC PANEL
ALT: 35 U/L (ref 0–53)
AST: 34 U/L (ref 0–37)
Albumin: 4.6 g/dL (ref 3.5–5.2)
Calcium: 9.7 mg/dL (ref 8.4–10.5)
Chloride: 105 mEq/L (ref 96–112)
Potassium: 4.3 mEq/L (ref 3.5–5.3)

## 2011-01-30 NOTE — Patient Instructions (Signed)
I will call you with the results of the blood work tomorrow

## 2011-01-30 NOTE — Progress Notes (Signed)
  Subjective:    Patient ID: Joseph Johnston, male    DOB: 1956-01-17, 56 y.o.   MRN: [id number]  HPI On December 19 he put out pine needles and several days after that noted fatigue, chest congestion and shortness of breath, headache and fatigue. He was seen here on the 24th and 28. He was given a Z-Pak and is no better. He has no other complaints.   Review of Systems     Objective:   Physical Exam alert and in no distress. Tympanic membranes and canals are normal. Throat is clear. Tonsils are normal. Neck is supple without adenopathy or thyromegaly. Cardiac exam shows a regular sinus rhythm without murmurs or gallops. Lungs are clear to auscultation. No scleral icterus noted. No axillary or inguinal adenopathy. Abdominal exam shows no hepatosplenomegaly.        Assessment & Plan:  Short of breath, headache, fatigue I will repeat the CBC and cmet

## 2011-02-11 ENCOUNTER — Other Ambulatory Visit (INDEPENDENT_AMBULATORY_CARE_PROVIDER_SITE_OTHER): Payer: BC Managed Care – PPO

## 2011-02-11 DIAGNOSIS — Z23 Encounter for immunization: Secondary | ICD-10-CM

## 2011-03-22 ENCOUNTER — Other Ambulatory Visit: Payer: Self-pay | Admitting: Family Medicine

## 2011-06-13 ENCOUNTER — Other Ambulatory Visit: Payer: Self-pay | Admitting: Family Medicine

## 2011-06-16 NOTE — Telephone Encounter (Signed)
Is this ok?

## 2011-06-19 ENCOUNTER — Encounter: Payer: Self-pay | Admitting: Family Medicine

## 2011-06-19 ENCOUNTER — Ambulatory Visit (INDEPENDENT_AMBULATORY_CARE_PROVIDER_SITE_OTHER): Payer: BC Managed Care – PPO | Admitting: Family Medicine

## 2011-06-19 VITALS — BP 122/82 | HR 74 | Wt 227.0 lb

## 2011-06-19 DIAGNOSIS — M25569 Pain in unspecified knee: Secondary | ICD-10-CM

## 2011-06-19 DIAGNOSIS — M25562 Pain in left knee: Secondary | ICD-10-CM

## 2011-06-19 NOTE — Progress Notes (Signed)
  Subjective:    Patient ID: Joseph Johnston, male    DOB: 16-May-1955, 56 y.o.   MRN: [id number]  HPI He complains of a one-week history of left knee pain that started after he mowed more years than normal and the last one was a yard that was on the slope. He complains of medial and anterior medial pain. No swelling, popping, locking or grinding. He  has used to ibuprofen per day  Review of Systems     Objective:   Physical Exam Exam of the left knee shows possible minimal effusion. Tender to palpation over the medial joint line. McMurray's testing causes pain. Negative anterior drawer.       Assessment & Plan:   1. Left medial knee pain   Take 4 Advil 3 times per day. Keep using the brace as needed. Touch bases with me in a couple weeks

## 2011-06-19 NOTE — Patient Instructions (Signed)
Take 4 Advil 3 times per day. Keep using the brace as needed. Touch bases with me in a couple weeks

## 2011-06-30 ENCOUNTER — Ambulatory Visit (INDEPENDENT_AMBULATORY_CARE_PROVIDER_SITE_OTHER): Payer: BC Managed Care – PPO | Admitting: Family Medicine

## 2011-06-30 ENCOUNTER — Other Ambulatory Visit: Payer: BC Managed Care – PPO

## 2011-06-30 ENCOUNTER — Encounter: Payer: Self-pay | Admitting: Family Medicine

## 2011-06-30 VITALS — BP 124/80 | Wt 223.0 lb

## 2011-06-30 DIAGNOSIS — M25562 Pain in left knee: Secondary | ICD-10-CM

## 2011-06-30 DIAGNOSIS — M25569 Pain in unspecified knee: Secondary | ICD-10-CM

## 2011-06-30 NOTE — Progress Notes (Signed)
  Subjective:    Patient ID: Joseph Johnston, male    DOB: 1955-08-21, 56 y.o.   MRN: [id number]  HPI He is here for recheck. He recently had difficulty with left knee pain after he mowed more yards and normal. He continues to have pain that is now interfering with his ability to sleep. He does feel a popping sensation. He does also complain of a locking sensation that then will opt and he'll be available to straighten leg again.   Review of Systems     Objective:   Physical Exam Tender along the medial joint line. McMurray's testing was uncomfortable. Anterior drawer negative.       Assessment & Plan:  Probable medial meniscal damage. I will order an MRI.

## 2011-07-01 ENCOUNTER — Ambulatory Visit
Admission: RE | Admit: 2011-07-01 | Discharge: 2011-07-01 | Disposition: A | Discharge status: Home / Self Care | Payer: BC Managed Care – PPO | Source: Ambulatory Visit | Attending: Family Medicine | Admitting: Family Medicine

## 2011-07-01 DIAGNOSIS — M25562 Pain in left knee: Secondary | ICD-10-CM

## 2011-07-16 ENCOUNTER — Ambulatory Visit (INDEPENDENT_AMBULATORY_CARE_PROVIDER_SITE_OTHER): Payer: BC Managed Care – PPO | Admitting: Family Medicine

## 2011-07-16 ENCOUNTER — Encounter: Payer: Self-pay | Admitting: Family Medicine

## 2011-07-16 VITALS — BP 130/92 | HR 76 | Wt 223.0 lb

## 2011-07-16 DIAGNOSIS — L259 Unspecified contact dermatitis, unspecified cause: Secondary | ICD-10-CM

## 2011-07-16 NOTE — Progress Notes (Signed)
  Subjective:    Patient ID: Joseph Johnston, male    DOB: [birth year], 56 y.o.   MRN: [id number]  HPI He complains of pruritic lesions present on the head of the penis. He doesn't do a lot of yard work and also has several pruritic lesions present on his hands.   Review of Systems     Objective:   Physical Exam Uncircumcised male. The have the penis does have several erythematous slightly raised lesions with one that is ulcerated.       Assessment & Plan:   1. Contact dermatitis    recommend cortisone cream for the lesions.

## 2011-07-16 NOTE — Patient Instructions (Signed)
Use topical cortisone on the lesions

## 2011-09-18 ENCOUNTER — Encounter: Payer: Self-pay | Admitting: Family Medicine

## 2011-09-18 ENCOUNTER — Ambulatory Visit (INDEPENDENT_AMBULATORY_CARE_PROVIDER_SITE_OTHER): Payer: BC Managed Care – PPO | Admitting: Family Medicine

## 2011-09-18 VITALS — BP 122/84 | HR 104 | Temp 98.9°F | Wt 228.0 lb

## 2011-09-18 DIAGNOSIS — J209 Acute bronchitis, unspecified: Secondary | ICD-10-CM

## 2011-09-18 MED ORDER — AZITHROMYCIN 500 MG PO TABS: 500.0000 mg | ORAL_TABLET | Freq: Every day | ORAL | Status: AC

## 2011-09-18 NOTE — Patient Instructions (Signed)
Take the antibiotic daily for the next 3 days. If you not totally back to normal in a week call me for refill

## 2011-09-18 NOTE — Progress Notes (Signed)
  Subjective:    Patient ID: Joseph Johnston, male    DOB: [birth year], 56 y.o.   MRN: [id number]  HPI 5 days ago he started with a slight sore throat, nasal congestion, slight dizziness. Yesterday he started having difficulty with cough and today he has coarse voice continued nasal congestion. He also has fever and chills but no sore throat. He does not smoke and has no allergies   Review of Systems     Objective:   Physical Exam alert and in no distress. Tympanic membranes and canals are normal. Throat is clear. Tonsils are normal. Neck is supple without adenopathy or thyromegaly. Cardiac exam shows a regular sinus rhythm without murmurs or gallops. Lungs are clear to auscultation.        Assessment & Plan:

## 2011-10-01 ENCOUNTER — Ambulatory Visit (INDEPENDENT_AMBULATORY_CARE_PROVIDER_SITE_OTHER): Payer: BC Managed Care – PPO | Admitting: Family Medicine

## 2011-10-01 ENCOUNTER — Encounter: Payer: Self-pay | Admitting: Family Medicine

## 2011-10-01 VITALS — BP 128/80 | HR 78 | Ht 72.0 in | Wt 221.0 lb

## 2011-10-01 DIAGNOSIS — Z8719 Personal history of other diseases of the digestive system: Secondary | ICD-10-CM

## 2011-10-01 DIAGNOSIS — G4730 Sleep apnea, unspecified: Secondary | ICD-10-CM

## 2011-10-01 DIAGNOSIS — M109 Gout, unspecified: Secondary | ICD-10-CM

## 2011-10-01 DIAGNOSIS — E785 Hyperlipidemia, unspecified: Secondary | ICD-10-CM | POA: Insufficient documentation

## 2011-10-01 DIAGNOSIS — Z0000 Encounter for general adult medical examination without abnormal findings: Secondary | ICD-10-CM

## 2011-10-01 DIAGNOSIS — G2581 Restless legs syndrome: Secondary | ICD-10-CM

## 2011-10-01 DIAGNOSIS — N400 Enlarged prostate without lower urinary tract symptoms: Secondary | ICD-10-CM

## 2011-10-01 DIAGNOSIS — Z8249 Family history of ischemic heart disease and other diseases of the circulatory system: Secondary | ICD-10-CM

## 2011-10-01 DIAGNOSIS — Z209 Contact with and (suspected) exposure to unspecified communicable disease: Secondary | ICD-10-CM

## 2011-10-01 DIAGNOSIS — Z23 Encounter for immunization: Secondary | ICD-10-CM

## 2011-10-01 LAB — CBC WITH DIFFERENTIAL/PLATELET
Basophils Absolute: 0 10*3/uL (ref 0.0–0.1)
Basophils Relative: 1 % (ref 0–1)
MCHC: 35.6 g/dL (ref 30.0–36.0)
Neutro Abs: 2.1 10*3/uL (ref 1.7–7.7)
Neutrophils Relative %: 39 % — ABNORMAL LOW (ref 43–77)
RDW: 12.9 % (ref 11.5–15.5)

## 2011-10-01 LAB — COMPREHENSIVE METABOLIC PANEL
AST: 32 U/L (ref 0–37)
Albumin: 4.6 g/dL (ref 3.5–5.2)
Alkaline Phosphatase: 67 U/L (ref 39–117)
BUN: 19 mg/dL (ref 6–23)
Potassium: 4.6 mEq/L (ref 3.5–5.3)
Sodium: 139 mEq/L (ref 135–145)

## 2011-10-01 LAB — LIPID PANEL
Cholesterol: 207 mg/dL — ABNORMAL HIGH (ref 0–200)
Triglycerides: 208 mg/dL — ABNORMAL HIGH (ref <150)

## 2011-10-01 LAB — HEMOCCULT GUIAC POC 1CARD (OFFICE)

## 2011-10-01 MED ORDER — TERAZOSIN HCL 2 MG PO CAPS: 2.0000 mg | ORAL_CAPSULE | Freq: Every day | ORAL | Status: DC

## 2011-10-01 MED ORDER — ROPINIROLE HCL 0.25 MG PO TABS: 0.2500 mg | ORAL_TABLET | ORAL | Status: DC

## 2011-10-01 NOTE — Progress Notes (Signed)
Subjective:    Patient ID: Joseph Johnston, male    DOB: 1955-09-20, 56 y.o.   MRN: [id number]  HPI He is here for complete examination. He does have an underlying history of probable ulcerative colitis and we'll get a followup with his gastroenterologist in the near future. He continues to have urinary symptoms of hesitancy and decreased stream. He has not seen much response from the Hytrin. He does have a history of sleep apnea and this was reviewed. It was for a mild and more of an issue of RLS. He is doing well on his present medication regimen. He has not had any gout attacks. He did have a falsely positive HIV test in the past. Review his record indicates elevated lipids. Further discussion with him indicates his sister had an MI at age 43. His father had an unknown cardiac condition which sounds more rhythm related however he is not sure. He continues to have intermittent shoulder and knee trouble but claims is mainly on work. His social and family history were reviewed.   Review of Systems Negative except as above    Objective:   Physical Exam BP 128/80  Pulse 78  Ht 6' (1.829 m)  Wt 221 lb (100.245 kg)  BMI 29.97 kg/m2  General Appearance:    Alert, cooperative, no distress, appears stated age  Head:    Normocephalic, without obvious abnormality, atraumatic  Eyes:    PERRL, conjunctiva/corneas clear, EOM's intact, fundi    benign  Ears:    Normal TM's and external ear canals  Nose:   Nares normal, mucosa normal, no drainage or sinus   tenderness  Throat:   Lips, mucosa, and tongue normal; teeth and gums normal  Neck:   Supple, no lymphadenopathy;  thyroid:  no   enlargement/tenderness/nodules; no carotid   bruit or JVD  Back:    Spine nontender, no curvature, ROM normal, no CVA     tenderness  Lungs:     Clear to auscultation bilaterally without wheezes, rales or     ronchi; respirations unlabored  Chest Wall:    No tenderness or deformity   Heart:    Regular rate and rhythm,  S1 and S2 normal, no murmur, rub   or gallop  Breast Exam:    No chest wall tenderness, masses or gynecomastia  Abdomen:     Soft, non-tender, nondistended, normoactive bowel sounds,    no masses, no hepatosplenomegaly  Genitalia:    Normal male external genitalia without lesions.  Testicles without masses.  No inguinal hernias.  Rectal:    Normal sphincter tone, no masses or tenderness; guaiac negative stool.  Prostate smooth, no nodules, not enlarged.  Extremities:   No clubbing, cyanosis or edema  Pulses:   2+ and symmetric all extremities  Skin:   Skin color, texture, turgor normal, no rashes or lesions  Lymph nodes:   Cervical, supraclavicular, and axillary nodes normal  Neurologic:   CNII-XII intact, normal strength, sensation and gait; reflexes 2+ and symmetric throughout          Psych:   Normal mood, affect, hygiene and grooming.           Assessment & Plan:   1. History of ulcerative colitis    2. BPH (benign prostatic hyperplasia)  terazosin (HYTRIN) 2 MG capsule  3. Sleep apnea    4. RLS (restless legs syndrome)  rOPINIRole (REQUIP) 0.25 MG tablet  5. Need for prophylactic vaccination and inoculation against influenza  6. Routine general medical examination at a health care facility  CBC with Differential, Comprehensive metabolic panel, Lipid panel, Uric Acid  7. Gout  Uric Acid  8. Hyperlipidemia LDL goal < 100    9. Family history of heart disease in male family member before age 44  Ambulatory referral to Cardiology  10. Contact with or exposure to unspecified communicable disease  HIV Antibody   the medical record was cleared up. He had some misdiagnosed allergic issues. He has no true allergy to codeine products.

## 2011-10-01 NOTE — Patient Instructions (Signed)
I will call you with the results and we'll place you on a cholesterol medicine based on that.

## 2011-10-10 ENCOUNTER — Other Ambulatory Visit: Payer: Self-pay | Admitting: Family Medicine

## 2011-10-10 ENCOUNTER — Ambulatory Visit (INDEPENDENT_AMBULATORY_CARE_PROVIDER_SITE_OTHER): Payer: BC Managed Care – PPO | Admitting: Family Medicine

## 2011-10-10 DIAGNOSIS — Z21 Asymptomatic human immunodeficiency virus [HIV] infection status: Secondary | ICD-10-CM

## 2011-10-10 DIAGNOSIS — B20 Human immunodeficiency virus [HIV] disease: Secondary | ICD-10-CM

## 2011-10-10 LAB — COMPREHENSIVE METABOLIC PANEL
ALT: 31 U/L (ref 0–53)
AST: 40 U/L — ABNORMAL HIGH (ref 0–37)
CO2: 25 mEq/L (ref 19–32)
Calcium: 10 mg/dL (ref 8.4–10.5)
Chloride: 104 mEq/L (ref 96–112)
Creat: 0.93 mg/dL (ref 0.50–1.35)
Potassium: 4.4 mEq/L (ref 3.5–5.3)
Sodium: 137 mEq/L (ref 135–145)
Total Protein: 7.9 g/dL (ref 6.0–8.3)

## 2011-10-10 LAB — LIPID PANEL: Total CHOL/HDL Ratio: 7.3 Ratio

## 2011-10-10 LAB — CBC WITH DIFFERENTIAL/PLATELET
Basophils Absolute: 0 10*3/uL (ref 0.0–0.1)
Basophils Relative: 0 % (ref 0–1)
Eosinophils Relative: 1 % (ref 0–5)
Lymphocytes Relative: 48 % — ABNORMAL HIGH (ref 12–46)
Neutro Abs: 2.3 10*3/uL (ref 1.7–7.7)
Platelets: 278 10*3/uL (ref 150–400)
RDW: 13 % (ref 11.5–15.5)
WBC: 5.1 10*3/uL (ref 4.0–10.5)

## 2011-10-10 NOTE — Progress Notes (Signed)
  Subjective:    Patient ID: Joseph Johnston, male    DOB: [birth year], 56 y.o.   MRN: [id number]  HPI He is here for consultation. Recent blood work did show HIV positive with Western blot being positive. Review his record indicates that have been negative in the past and then had equivocal results. Further discussion with him indicates that in late 2012 he did have a viral infection which most likely was a started HIV. Presently he is having no fever, chills, pulmonary symptoms, GI problems.   Review of Systems     Objective:   Physical Exam Alert and in no distress otherwise not examined      Assessment & Plan:   1. HIV (human immunodeficiency virus infection)  HIV 1 RNA quant-no reflex-bld, T-helper cells (CD4) count, CBC with Differential, Comprehensive metabolic panel, Lipid panel, TB Skin Test, Hepatitis B surface antibody, Hepatitis A antibody, total, Hepatitis C antibody   one half hour spent discussing the diagnosis of HIV with him and his particular case of being initially negative and then slowly turning to positive. Discussed anti-retroviral medications and the possibility of him getting involved in a research protocol. We will discuss this again when the results come in.

## 2011-10-11 LAB — HEPATITIS B SURFACE ANTIBODY, QUANTITATIVE: Hepatitis B-Post: 1000 m[IU]/mL

## 2011-10-11 LAB — HEPATITIS C ANTIBODY: HCV Ab: NEGATIVE

## 2011-10-13 LAB — TB SKIN TEST
Induration: 0 mm
TB Skin Test: NEGATIVE

## 2011-10-13 LAB — T-HELPER CELLS (CD4) COUNT (NOT AT ARMC)
Absolute CD4: 636 /uL (ref 381–1469)
Total Lymphocyte: 48 % — ABNORMAL HIGH (ref 12–46)
Total lymphocyte count: 2448 /uL (ref 700–3300)

## 2011-10-15 ENCOUNTER — Other Ambulatory Visit: Payer: Self-pay

## 2011-10-16 ENCOUNTER — Telehealth: Payer: Self-pay

## 2011-10-16 DIAGNOSIS — B20 Human immunodeficiency virus [HIV] disease: Secondary | ICD-10-CM | POA: Insufficient documentation

## 2011-10-16 NOTE — Telephone Encounter (Signed)
Referral received from Dr Sharlot Gowda for this patient. It appears some labs have been started.  I will add a genotype to the viral load.  Pt will need additional labs and vaccines  that can be done at the initial visit with the physician . I will call to offer an appointment with the physician.   Pt given appointment with Dr Daiva Eves.  No intake .  Laurell Josephs, RN

## 2011-10-16 NOTE — Telephone Encounter (Signed)
Opened in error

## 2011-10-17 ENCOUNTER — Other Ambulatory Visit: Payer: Self-pay | Admitting: Family Medicine

## 2011-10-23 ENCOUNTER — Encounter: Payer: Self-pay | Admitting: Cardiology

## 2011-10-23 ENCOUNTER — Ambulatory Visit (INDEPENDENT_AMBULATORY_CARE_PROVIDER_SITE_OTHER): Payer: BC Managed Care – PPO | Admitting: Cardiology

## 2011-10-23 VITALS — BP 137/86 | HR 85 | Ht 71.0 in | Wt 225.0 lb

## 2011-10-23 DIAGNOSIS — R0600 Dyspnea, unspecified: Secondary | ICD-10-CM | POA: Insufficient documentation

## 2011-10-23 DIAGNOSIS — R0609 Other forms of dyspnea: Secondary | ICD-10-CM

## 2011-10-23 DIAGNOSIS — E785 Hyperlipidemia, unspecified: Secondary | ICD-10-CM

## 2011-10-23 NOTE — Assessment & Plan Note (Signed)
Given risk factors we'll plan exercise treadmill for risk stratification.

## 2011-10-23 NOTE — Patient Instructions (Addendum)
Your physician recommends that you schedule a follow-up appointment in: AS NEEDED  Your physician has requested that you have an exercise tolerance test. For further information please visit [email or web address] Please also follow instruction sheet, as given.    Exercise Stress Electrocardiography An exercise stress test is a heart test (EKG) which is done while you are moving. You will walk on a treadmill. This test will tell your doctor how your heart does when it is forced to work harder and how much activity you can safely handle. BEFORE THE TEST  Wear shorts or athletic pants.   Wear comfortable tennis shoes.   Women need to wear a bra that allows patches to be put on under it.  TEST  An EKG cable will be attached to your waist. This cable is hooked up to patches, which look like round stickers stuck to your chest.   You will be asked to walk on the treadmill.   You will walk until you are too tired or until you are told to stop.   Tell the doctor right away if you have:   Chest pain.   Leg cramps.   Shortness of breath.   Dizziness.   The test may last 30 minutes to 1 hour. The timing depends on your physical condition and the condition of your heart.  AFTER THE TEST  You will rest for about 6 minutes. During this time, your heart rhythm and blood pressure will be checked.   The testing equipment will be removed from your body and you can get dressed.   You may go home or back to your hospital room. You may keep doing all your usual activities as told by your doctor.  Finding out the results of your test Ask when your test results will be ready. Make sure you get your test results. Document Released: 07/02/2007 Document Revised: 01/02/2011 Document Reviewed: 07/02/2007 [facility] Patient Information 2012 [place], [place].

## 2011-10-23 NOTE — Progress Notes (Signed)
  HPI: 56 year old male with no prior cardiac history for evaluation of dyspnea and hyperlipidemia. The patient has noted mild increased dyspnea on exertion recently. He denies orthopnea, PND, pedal edema, palpitations, syncope or chest pain. Because of his multiple risk factors we were asked to evaluate including his hyperlipidemia.  Current Outpatient Prescriptions  Medication Sig Dispense Refill  . allopurinol (ZYLOPRIM) 100 MG tablet TAKE ONE TABLET BY MOUTH EVERY DAY  30 tablet  4  . B Complex-C (SUPER B COMPLEX PO) Take 1 tablet by mouth daily.      . balsalazide (COLAZAL) 750 MG capsule Take 2,250 mg by mouth 2 (two) times daily.        Marland Kitchen KRILL OIL PO Take 1 tablet by mouth daily.      . Multiple Vitamin (MULTIVITAMIN) capsule Take 1 capsule by mouth daily.      . NON FORMULARY Mood enhanser 1 tab po qd      . rOPINIRole (REQUIP) 0.25 MG tablet Take 1 tablet (0.25 mg total) by mouth 1 day or 1 dose.  30 tablet  11  . terazosin (HYTRIN) 2 MG capsule Take 1 capsule (2 mg total) by mouth at bedtime.  30 capsule  11    Allergies  Allergen Reactions  . Penicillins     REACTION: rash/hives    Past Medical History  Diagnosis Date  . Gout   . Sleep apnea   . 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 Seborrheic dermatitis   . Ulcerative colitis   . Hyperlipidemia   . HIV (human immunodeficiency virus infection)     Past Surgical History  Procedure Date  . Left shoulder surgery     History   Social History  . Marital Status: Single    Spouse Name: N/A    Number of Children: N/A  . Years of Education: N/A   Occupational History  . landscaping    Social History Main Topics  . Smoking status: Former Smoker -- 0.0 packs/day for 30 years    Quit date: 01/28/2000  . Smokeless tobacco: Never Used  . Alcohol Use: No     Former alcholic; none since 2000  . Drug Use: No  . Sexually Active: Yes -- Male partner(s)   Other Topics Concern  . Not on file   Social History Narrative  . No  narrative on file    Family History  Problem Relation Age of Onset  . Coronary artery disease Sister 58    MI    ROS:  no fevers or chills, productive cough, hemoptysis, dysphasia, odynophagia, melena, hematochezia, dysuria, hematuria, rash, seizure activity, orthopnea, PND, pedal edema, claudication. Remaining systems are negative.  Physical Exam:   Blood pressure 137/86, pulse 85, height 5\' 11"  (1.803 m), weight 225 lb (102.059 kg).  General:  Well developed/well nourished in NAD Skin warm/dry, tattoos noted Patient not depressed No peripheral clubbing Back-normal HEENT-normal/normal eyelids Neck supple/normal carotid upstroke bilaterally; no bruits; no JVD; no thyromegaly chest - CTA/ normal expansion CV - RRR/normal S1 and S2; no murmurs, rubs or gallops;  PMI nondisplaced Abdomen -NT/ND, no HSM, no mass, + bowel sounds, no bruit 2+ femoral pulses, no bruits Ext-no edema, chords, 2+ DP Neuro-grossly nonfocal  ECG sinus rhythm at a rate of 85. No ST changes.

## 2011-10-23 NOTE — Assessment & Plan Note (Addendum)
Recent LDL was 176. Given his risk factors I feel that a statin is indicated. I would consider Crestor 10 mg daily. He will followup with his primary care physician for this issue.

## 2011-11-06 ENCOUNTER — Ambulatory Visit (INDEPENDENT_AMBULATORY_CARE_PROVIDER_SITE_OTHER): Payer: BC Managed Care – PPO | Admitting: Family Medicine

## 2011-11-06 ENCOUNTER — Encounter: Payer: Self-pay | Admitting: Family Medicine

## 2011-11-06 VITALS — BP 130/90 | HR 85 | Temp 97.9°F | Wt 228.0 lb

## 2011-11-06 DIAGNOSIS — H6980 Other specified disorders of Eustachian tube, unspecified ear: Secondary | ICD-10-CM

## 2011-11-06 DIAGNOSIS — H6990 Unspecified Eustachian tube disorder, unspecified ear: Secondary | ICD-10-CM

## 2011-11-06 NOTE — Patient Instructions (Signed)
Use Afrin nasal spray twice a day for the next 3 or 4 days and see if that'll help with your congestion

## 2011-11-06 NOTE — Progress Notes (Signed)
  Subjective:    Patient ID: Joseph Johnston, male    DOB: 03/26/1955, 56 y.o.   MRN: [id number]  HPI He has a two-week history of right ear congestion, itching. He is also noted some swelling in the posterolateral neck area. He does have a slight cough but no earache, fever or chills the He also feels a lump in his left axilla. He was recently seen by cardiology and is scheduled to be seen in the infectious disease clinic for followup on his HIV.   Review of Systems     Objective:   Physical Exam alert and in no distress. Tympanic membranes and canals are normal. Throat is clear. Tonsils are normal. Neck is supple without adenopathy or thyromegaly. Cardiac exam shows a regular sinus rhythm without murmurs or gallops. Lungs are clear to auscultation.        Assessment & Plan:   1. Eustachian tube dysfunction    to command he try Afrin nasal spray for the next several days to see if this will help.

## 2011-11-10 ENCOUNTER — Ambulatory Visit (INDEPENDENT_AMBULATORY_CARE_PROVIDER_SITE_OTHER): Payer: BC Managed Care – PPO | Admitting: Infectious Disease

## 2011-11-10 ENCOUNTER — Other Ambulatory Visit: Payer: Self-pay | Admitting: *Deleted

## 2011-11-10 ENCOUNTER — Encounter: Payer: Self-pay | Admitting: Infectious Disease

## 2011-11-10 VITALS — BP 167/107 | HR 80 | Temp 98.0°F | Ht 71.0 in | Wt 229.0 lb

## 2011-11-10 DIAGNOSIS — B20 Human immunodeficiency virus [HIV] disease: Secondary | ICD-10-CM

## 2011-11-10 DIAGNOSIS — Z23 Encounter for immunization: Secondary | ICD-10-CM

## 2011-11-10 DIAGNOSIS — I10 Essential (primary) hypertension: Secondary | ICD-10-CM | POA: Insufficient documentation

## 2011-11-10 DIAGNOSIS — E785 Hyperlipidemia, unspecified: Secondary | ICD-10-CM

## 2011-11-10 MED ORDER — EMTRICITABINE-TENOFOVIR DF 200-300 MG PO TABS: 1.0000 | ORAL_TABLET | Freq: Every day | ORAL | Status: DC

## 2011-11-10 MED ORDER — DOLUTEGRAVIR SODIUM 50 MG PO TABS: 50.0000 mg | ORAL_TABLET | Freq: Every day | ORAL | Status: DC

## 2011-11-10 NOTE — Assessment & Plan Note (Signed)
Will need anti HTN in the  future

## 2011-11-10 NOTE — Progress Notes (Signed)
Subjective:    Patient ID: Joseph Johnston, male    DOB: Dec 06, 1955, 56 y.o.   MRN: [id number]  HPI  56 year old Caucasian man who has been diagnosed with HIV, with VL in 20K range, wild type virus and CD4 above 600. He is followed by Dr. Susann Givens for primary care and has comoribid conditions of UC, hyperlipidemia, gout and HTN.  We reviewed all of the first line DHHS regimens and also "alternative regimens" After much discussion we felt that regimen of   TIVICAY 50mg  daily along with truvada daily was best option.  He should not take a MVI with this regimen UNLESS he also takes food with it. He plans on taking ARV in evening. Ran drug interaction list with all his meds. I spent greater than 60 minutes with the patient including greater than 50% of time in face to face counsel of the patient and in coordination of their care.    Review of Systems  Constitutional: Negative for fever, chills, diaphoresis, activity change, appetite change, fatigue and unexpected weight change.  HENT: Negative for congestion, sore throat, rhinorrhea, sneezing, trouble swallowing and sinus pressure.   Eyes: Negative for photophobia and visual disturbance.  Respiratory: Negative for cough, chest tightness, shortness of breath, wheezing and stridor.   Cardiovascular: Negative for chest pain, palpitations and leg swelling.  Gastrointestinal: Negative for nausea, vomiting, abdominal pain, diarrhea, constipation, blood in stool, abdominal distention and anal bleeding.  Genitourinary: Negative for dysuria, hematuria, flank pain and difficulty urinating.  Musculoskeletal: Negative for myalgias, back pain, joint swelling, arthralgias and gait problem.  Skin: Negative for color change, pallor, rash and wound.  Neurological: Negative for dizziness, tremors, weakness and light-headedness.  Hematological: Negative for adenopathy. Does not bruise/bleed easily.  Psychiatric/Behavioral: Negative for behavioral problems,  confusion, disturbed wake/sleep cycle, dysphoric mood, decreased concentration and agitation.       Objective:   Physical Exam  Constitutional: He is oriented to person, place, and time. He appears well-developed and well-nourished. No distress.  HENT:  Head: Normocephalic and atraumatic.  Mouth/Throat: Oropharynx is clear and moist. No oropharyngeal exudate.  Eyes: Conjunctivae normal and EOM are normal. Pupils are equal, round, and reactive to light. No scleral icterus.  Neck: Normal range of motion. Neck supple. No JVD present.  Cardiovascular: Normal rate, regular rhythm and normal heart sounds.  Exam reveals no gallop and no friction rub.   No murmur heard. Pulmonary/Chest: Effort normal and breath sounds normal. No respiratory distress. He has no wheezes. He has no rales. He exhibits no tenderness.  Abdominal: He exhibits no distension and no mass. There is no tenderness. There is no rebound and no guarding.  Musculoskeletal: He exhibits no edema and no tenderness.  Lymphadenopathy:    He has no cervical adenopathy.  Neurological: He is alert and oriented to person, place, and time. He has normal reflexes. He exhibits normal muscle tone. Coordination normal.  Skin: Skin is warm and dry. He is not diaphoretic. No erythema. No pallor.  Psychiatric: He has a normal mood and affect. His behavior is normal. Judgment and thought content normal.          Assessment & Plan:  Human immunodeficiency virus (HIV) disease Start TIVICAY and truvada. Bring back for labs in a month and appt in 6 weeks.  Will check HLa B5701 to see if can use Abacavir in future which will be part of STR in future  Hyperlipidemia LDL goal < 100 ARV regimen is fairly lipid neutral.  He should be able to be on any statins with this regimen  HTN (hypertension) Will need anti HTN in the  future

## 2011-11-10 NOTE — Assessment & Plan Note (Signed)
Start TIVICAY and truvada. Bring back for labs in a month and appt in 6 weeks.  Will check HLa B5701 to see if can use Abacavir in future which will be part of STR in future

## 2011-11-10 NOTE — Addendum Note (Signed)
Addended by: Starleen Arms D on: 11/10/2011 10:10 AM   Modules accepted: Orders

## 2011-11-10 NOTE — Assessment & Plan Note (Signed)
ARV regimen is fairly lipid neutral. He should be able to be on any statins with this regimen

## 2011-11-11 ENCOUNTER — Telehealth: Payer: Self-pay | Admitting: Family Medicine

## 2011-11-11 NOTE — Telephone Encounter (Signed)
PT HAS APPT NOV 8

## 2011-11-11 NOTE — Telephone Encounter (Signed)
He is to keep track of his blood pressures on a weekly basis and bring them in as well as his blood pressure cuff to determine if he needs to be placed on a medication.

## 2011-11-14 ENCOUNTER — Other Ambulatory Visit: Payer: Self-pay | Admitting: *Deleted

## 2011-11-14 DIAGNOSIS — B20 Human immunodeficiency virus [HIV] disease: Secondary | ICD-10-CM

## 2011-11-14 MED ORDER — EMTRICITABINE-TENOFOVIR DF 200-300 MG PO TABS: 1.0000 | ORAL_TABLET | Freq: Every day | ORAL | Status: DC

## 2011-11-17 ENCOUNTER — Encounter: Payer: BC Managed Care – PPO | Admitting: Physician Assistant

## 2011-11-18 ENCOUNTER — Ambulatory Visit (INDEPENDENT_AMBULATORY_CARE_PROVIDER_SITE_OTHER): Payer: BC Managed Care – PPO | Admitting: Physician Assistant

## 2011-11-18 DIAGNOSIS — R0600 Dyspnea, unspecified: Secondary | ICD-10-CM

## 2011-11-18 DIAGNOSIS — R0609 Other forms of dyspnea: Secondary | ICD-10-CM

## 2011-11-18 DIAGNOSIS — R0989 Other specified symptoms and signs involving the circulatory and respiratory systems: Secondary | ICD-10-CM

## 2011-11-18 NOTE — Procedures (Signed)
Exercise Treadmill Test  Pre-Exercise Testing Evaluation Rhythm: normal sinus  Rate: 85   PR:  .13 QRS:  .10  QT:  .36 QTc: .43     Test  Exercise Tolerance Test Ordering MD: Olga Millers, MD  Interpreting MD: Tereso Newcomer , PA-C  Unique Test No: 1  Treadmill:  1  Indication for ETT: exertional dyspnea  Contraindication to ETT: No   Stress Modality: exercise - treadmill  Cardiac Imaging Performed: non   Protocol: standard Bruce - maximal  Max BP:  185/66  Max MPHR (bpm):  166 85% MPR (bpm):  141  MPHR obtained (bpm):  166 % MPHR obtained:  100%  Reached 85% MPHR (min:sec):  4:49 Total Exercise Time (min-sec):  9:00  Workload in METS:  10.0 Borg Scale: 11  Reason ETT Terminated:  desired heart rate attained    ST Segment Analysis At Rest: normal ST segments - no evidence of significant ST depression With Exercise: no evidence of significant ST depression  Other Information Arrhythmia:  No Angina during ETT:  absent (0) Quality of ETT:  diagnostic  ETT Interpretation:  normal - no evidence of ischemia by ST analysis  Comments: Good exercise tolerance. No chest pain. Normal BP response to exercise. No ST-T changes to suggest ischemia.   Recommendations: Results d/w Lonia Chimera. Follow up with Dr. Olga Millers as directed. Signed,  Tereso Newcomer, PA-C  9:01 AM 11/18/2011

## 2011-12-02 ENCOUNTER — Encounter: Payer: Self-pay | Admitting: Internal Medicine

## 2011-12-02 ENCOUNTER — Telehealth: Payer: Self-pay | Admitting: Internal Medicine

## 2011-12-02 NOTE — Telephone Encounter (Signed)
i am abstracting pt chart for his visit on Friday, I  have in the paper chart that pt has an allergy to allopurinol but I see pt is on that med. I just need to know if he is taking the medicine or if he is allergic to it.

## 2011-12-05 ENCOUNTER — Encounter: Payer: Self-pay | Admitting: Family Medicine

## 2011-12-05 ENCOUNTER — Ambulatory Visit (INDEPENDENT_AMBULATORY_CARE_PROVIDER_SITE_OTHER): Payer: BC Managed Care – PPO | Admitting: Family Medicine

## 2011-12-05 VITALS — BP 132/92 | HR 88 | Wt 230.0 lb

## 2011-12-05 DIAGNOSIS — B20 Human immunodeficiency virus [HIV] disease: Secondary | ICD-10-CM

## 2011-12-05 DIAGNOSIS — I10 Essential (primary) hypertension: Secondary | ICD-10-CM

## 2011-12-05 NOTE — Progress Notes (Signed)
  Subjective:    Patient ID: Joseph Johnston, male    DOB: [birth year], 56 y.o.   MRN: [id number]  HPI He is here for a recheck on his blood pressure. He did bring his blood pressure machine in and compare to ours. There is at least a 10 point difference with systolic and diastolic readings. His present blood pressure is 132/92. He also was placed on anti-retroviral medications. He did have some difficulty getting coverage from his insurance carrier. He is going to go back and asked to be placed on a different one drug regimen.   Review of Systems     Objective:   Physical Exam Alert and in no distress otherwise not examined       Assessment & Plan:   1. HTN (hypertension)   2. Human immunodeficiency virus (HIV) disease    Recommend he continue present medication regimen. Recheck here as needed.

## 2011-12-15 ENCOUNTER — Other Ambulatory Visit: Payer: Self-pay | Admitting: Infectious Disease

## 2011-12-15 ENCOUNTER — Other Ambulatory Visit: Payer: BC Managed Care – PPO

## 2011-12-15 DIAGNOSIS — Z113 Encounter for screening for infections with a predominantly sexual mode of transmission: Secondary | ICD-10-CM

## 2011-12-15 DIAGNOSIS — B20 Human immunodeficiency virus [HIV] disease: Secondary | ICD-10-CM

## 2011-12-15 LAB — COMPLETE METABOLIC PANEL WITH GFR
ALT: 20 U/L (ref 0–53)
AST: 27 U/L (ref 0–37)
Albumin: 4.8 g/dL (ref 3.5–5.2)
Alkaline Phosphatase: 67 U/L (ref 39–117)
BUN: 14 mg/dL (ref 6–23)
CO2: 27 mEq/L (ref 19–32)
Calcium: 10.1 mg/dL (ref 8.4–10.5)
Chloride: 103 mEq/L (ref 96–112)
Creat: 0.99 mg/dL (ref 0.50–1.35)
GFR, Est African American: >89 mL/min
GFR, Est Non African American: 86 mL/min
Glucose, Bld: 103 mg/dL — ABNORMAL HIGH (ref 70–99)
Potassium: 4.4 mEq/L (ref 3.5–5.3)
Sodium: 138 mEq/L (ref 135–145)
Total Bilirubin: 0.4 mg/dL (ref 0.3–1.2)
Total Protein: 7.9 g/dL (ref 6.0–8.3)

## 2011-12-15 LAB — CBC WITH DIFFERENTIAL/PLATELET
Basophils Absolute: 0 10*3/uL (ref 0.0–0.1)
Basophils Relative: 0 % (ref 0–1)
Eosinophils Absolute: 0.1 10*3/uL (ref 0.0–0.7)
Eosinophils Relative: 2 % (ref 0–5)
HCT: 44.9 % (ref 39.0–52.0)
Hemoglobin: 16.2 g/dL (ref 13.0–17.0)
Lymphocytes Relative: 49 % — ABNORMAL HIGH (ref 12–46)
Lymphs Abs: 2.7 10*3/uL (ref 0.7–4.0)
MCH: 31.5 pg (ref 26.0–34.0)
MCHC: 36.1 g/dL — ABNORMAL HIGH (ref 30.0–36.0)
MCV: 87.4 fL (ref 78.0–100.0)
Monocytes Absolute: 0.3 10*3/uL (ref 0.1–1.0)
Monocytes Relative: 6 % (ref 3–12)
Neutro Abs: 2.4 10*3/uL (ref 1.7–7.7)
Neutrophils Relative %: 43 % (ref 43–77)
Platelets: 294 10*3/uL (ref 150–400)
RBC: 5.14 MIL/uL (ref 4.22–5.81)
RDW: 13.8 % (ref 11.5–15.5)
WBC: 5.6 10*3/uL (ref 4.0–10.5)

## 2011-12-16 LAB — HEPATITIS B SURFACE ANTIBODY, QUANTITATIVE: Hepatitis B-Post: 1000 m[IU]/mL

## 2011-12-16 LAB — T-HELPER CELL (CD4) - (RCID CLINIC ONLY)
CD4 % Helper T Cell: 29 % — ABNORMAL LOW (ref 33–55)
CD4 T Cell Abs: 850 uL (ref 400–2700)

## 2011-12-16 LAB — HEPATITIS B SURFACE ANTIGEN: Hepatitis B Surface Ag: NEGATIVE

## 2011-12-16 LAB — HIV-1 RNA QUANT-NO REFLEX-BLD: HIV-1 RNA Quant, Log: 1.54 {Log} — ABNORMAL HIGH (ref <1.30)

## 2011-12-24 ENCOUNTER — Ambulatory Visit: Payer: BC Managed Care – PPO | Admitting: Infectious Disease

## 2011-12-26 LAB — HLA B*5701: HLA-B*5701: NEGATIVE

## 2011-12-31 ENCOUNTER — Ambulatory Visit (INDEPENDENT_AMBULATORY_CARE_PROVIDER_SITE_OTHER): Payer: BC Managed Care – PPO | Admitting: Infectious Disease

## 2011-12-31 ENCOUNTER — Encounter: Payer: Self-pay | Admitting: Infectious Disease

## 2011-12-31 VITALS — BP 154/94 | HR 81 | Temp 97.4°F | Ht 72.0 in | Wt 230.0 lb

## 2011-12-31 DIAGNOSIS — Z23 Encounter for immunization: Secondary | ICD-10-CM

## 2011-12-31 DIAGNOSIS — B20 Human immunodeficiency virus [HIV] disease: Secondary | ICD-10-CM

## 2011-12-31 NOTE — Progress Notes (Signed)
Subjective:    Patient ID: Joseph Johnston, male    DOB: 1955-02-26, 56 y.o.   MRN: [id number]  HPI  56 year old man recently diagnosed with HIV (wild type virus) started on tivicay and truvada. He has noticed headaches that occur within 2 hours of taking his ARV and then dissipate when he sleeps. He is bothered by these and we had discussion about changing ARVs specifically to [facility]. AFter much discussion he preferred to stay on his current regimen for now. Otherwise he is doing very well. His VL was 35 after only one month of therapy and he is near undetectable. I spent greater than 45 minutes with the patient including greater than 50% of time in face to face counsel of the patient and in coordination of their care.   Review of Systems  Constitutional: Negative for fever, chills, diaphoresis, activity change, appetite change, fatigue and unexpected weight change.  HENT: Negative for congestion, sore throat, rhinorrhea, sneezing, trouble swallowing and sinus pressure.   Eyes: Negative for photophobia and visual disturbance.  Respiratory: Negative for cough, chest tightness, shortness of breath, wheezing and stridor.   Cardiovascular: Negative for chest pain, palpitations and leg swelling.  Gastrointestinal: Negative for nausea, vomiting, abdominal pain, diarrhea, constipation, blood in stool, abdominal distention and anal bleeding.  Genitourinary: Negative for dysuria, hematuria, flank pain and difficulty urinating.  Musculoskeletal: Negative for myalgias, back pain, joint swelling, arthralgias and gait problem.  Skin: Negative for color change, pallor, rash and wound.  Neurological: Positive for headaches. Negative for dizziness, tremors, weakness and light-headedness.  Hematological: Negative for adenopathy. Does not bruise/bleed easily.  Psychiatric/Behavioral: Negative for behavioral problems, confusion, sleep disturbance, dysphoric mood, decreased concentration and agitation.        Objective:   Physical Exam  Constitutional: He is oriented to person, place, and time. He appears well-developed and well-nourished. No distress.  HENT:  Head: Normocephalic and atraumatic.  Mouth/Throat: Oropharynx is clear and moist. No oropharyngeal exudate.  Eyes: Conjunctivae normal and EOM are normal. Pupils are equal, round, and reactive to light. No scleral icterus.  Neck: Normal range of motion. Neck supple. No JVD present.  Cardiovascular: Normal rate, regular rhythm and normal heart sounds.  Exam reveals no gallop and no friction rub.   No murmur heard. Pulmonary/Chest: Effort normal and breath sounds normal. No respiratory distress. He has no wheezes. He has no rales. He exhibits no tenderness.  Abdominal: He exhibits no distension and no mass. There is no tenderness. There is no rebound and no guarding.  Musculoskeletal: He exhibits no edema and no tenderness.  Lymphadenopathy:    He has no cervical adenopathy.  Neurological: He is alert and oriented to person, place, and time. He has normal reflexes. He exhibits normal muscle tone. Coordination normal.  Skin: Skin is warm and dry. He is not diaphoretic. No erythema. No pallor.  Psychiatric: He has a normal mood and affect. His behavior is normal. Judgment and thought content normal.          Assessment & Plan:   HIV: continue TIVicay and truvada. There was also concern about complexity of meds from two different drug companies. I offeered to solve this by switch to stribild (GIlead)or switch to tivicay and epzicom (both [employer] and will be one pill once a day in very near future) he preferred current regimen  HTN: likely with a bit of white coat htn fu with Dr. Susann Givens  Headache: could be due to tivicay. Again offered switch but  he preferred to stay on current regimen

## 2012-01-07 ENCOUNTER — Encounter: Payer: Self-pay | Admitting: Family Medicine

## 2012-03-05 ENCOUNTER — Other Ambulatory Visit: Payer: Self-pay | Admitting: Medical

## 2012-03-08 ENCOUNTER — Encounter: Payer: Self-pay | Admitting: Family Medicine

## 2012-03-08 ENCOUNTER — Ambulatory Visit (INDEPENDENT_AMBULATORY_CARE_PROVIDER_SITE_OTHER): Payer: BC Managed Care – PPO | Admitting: Family Medicine

## 2012-03-08 VITALS — BP 122/84 | HR 100 | Wt 247.0 lb

## 2012-03-08 DIAGNOSIS — R351 Nocturia: Secondary | ICD-10-CM

## 2012-03-08 DIAGNOSIS — R32 Unspecified urinary incontinence: Secondary | ICD-10-CM

## 2012-03-08 DIAGNOSIS — R51 Headache: Secondary | ICD-10-CM

## 2012-03-08 DIAGNOSIS — B20 Human immunodeficiency virus [HIV] disease: Secondary | ICD-10-CM

## 2012-03-08 LAB — POCT URINALYSIS DIPSTICK
Leukocytes, UA: NEGATIVE
Nitrite, UA: NEGATIVE
Protein, UA: NEGATIVE
Urobilinogen, UA: NEGATIVE

## 2012-03-08 NOTE — Progress Notes (Signed)
  Subjective:    Patient ID: Joseph Johnston, male    DOB: 1955/09/12, 57 y.o.   MRN: [id number]  HPI He is here for evaluation of 2 issues. He continues to have difficulty with urinary symptoms. This is been going on for approximately a year. He presently is on Hytrin to help with  frequency. He is on 2 mg. In the last 4-5 months he is noted increased difficulty with nocturia. He can go as many as 7 times at night but does not have trouble during the day. At night he can actually have some episodes of  actually leaking urine and feeling wet and does not notice this during the day. He also is had difficulty with headaches but states this to starting on the retro-viral medications he is on. He will take the medication and almost 2 hours later developed a headache as well as falling asleep. Usually when he gets up the headache is gone. Now he is also having difficulty with headache with he wakes up with in the morning that he describes as different. He describes this as more of a difficulty with equilibrium and especially notes this on the right   Review of Systems     Objective:   Physical Exam alert and in no distress.EOMI. Other cranial nerves intact. Cerebellar testing normal. DTRs normal. Tympanic membranes and canals are normal. Throat is clear. Tonsils are normal. Neck is supple without adenopathy or thyromegaly. Cardiac exam shows a regular sinus rhythm without murmurs or gallops. Lungs are clear to auscultation.        Assessment & Plan:   1. Nocturia  Urinalysis Dipstick   Urinalysis Dipstick   Ambulatory referral to Urology  2. Urinary incontinence  Urinalysis Dipstick   Urinalysis Dipstick   Ambulatory referral to Urology  3. Headache    4. Human immunodeficiency virus (HIV) disease    recommend he followup with infectious disease about switching to a different anti-retroviral regimen since the headaches seem to be related to this. It's possible that he could have 2 separate  types of headaches however I would like to have him switch to regimen before we pursue this further.

## 2012-03-08 NOTE — Telephone Encounter (Signed)
Is this okay to refill? 

## 2012-04-12 ENCOUNTER — Encounter: Payer: Self-pay | Admitting: Infectious Disease

## 2012-04-16 ENCOUNTER — Encounter: Payer: Self-pay | Admitting: Infectious Disease

## 2012-04-19 ENCOUNTER — Other Ambulatory Visit: Payer: Self-pay | Admitting: Infectious Disease

## 2012-04-19 ENCOUNTER — Other Ambulatory Visit (INDEPENDENT_AMBULATORY_CARE_PROVIDER_SITE_OTHER): Payer: BC Managed Care – PPO

## 2012-04-19 ENCOUNTER — Telehealth: Payer: Self-pay | Admitting: Infectious Disease

## 2012-04-19 DIAGNOSIS — B20 Human immunodeficiency virus [HIV] disease: Secondary | ICD-10-CM

## 2012-04-19 DIAGNOSIS — Z113 Encounter for screening for infections with a predominantly sexual mode of transmission: Secondary | ICD-10-CM

## 2012-04-19 LAB — COMPLETE METABOLIC PANEL WITH GFR
Alkaline Phosphatase: 92 U/L (ref 39–117)
GFR, Est Non African American: 79 mL/min
Glucose, Bld: 107 mg/dL — ABNORMAL HIGH (ref 70–99)
Sodium: 138 mEq/L (ref 135–145)
Total Bilirubin: 0.6 mg/dL (ref 0.3–1.2)
Total Protein: 7.1 g/dL (ref 6.0–8.3)

## 2012-04-19 LAB — CBC WITH DIFFERENTIAL/PLATELET
Basophils Absolute: 0 10*3/uL (ref 0.0–0.1)
Eosinophils Relative: 2 % (ref 0–5)
Lymphocytes Relative: 55 % — ABNORMAL HIGH (ref 12–46)
Neutro Abs: 2.3 10*3/uL (ref 1.7–7.7)
Neutrophils Relative %: 38 % — ABNORMAL LOW (ref 43–77)
Platelets: 299 10*3/uL (ref 150–400)
RDW: 13.7 % (ref 11.5–15.5)
WBC: 6 10*3/uL (ref 4.0–10.5)

## 2012-04-19 LAB — LIPID PANEL
Cholesterol: 201 mg/dL — ABNORMAL HIGH (ref 0–200)
HDL: 30 mg/dL — ABNORMAL LOW (ref >39)
Total CHOL/HDL Ratio: 6.7 Ratio

## 2012-04-19 MED ORDER — ELVITEG-COBIC-EMTRICIT-TENOFDF 150-150-200-300 MG PO TABS: 1.0000 | ORAL_TABLET | Freq: Every day | ORAL | Status: DC

## 2012-04-19 NOTE — Telephone Encounter (Signed)
Patient aware. He did labs already this morning and he has a visit scheduled with you on 05/03/2012

## 2012-04-19 NOTE — Telephone Encounter (Signed)
Changed pt to STRIBILD.  He needs to come in for HIV VL, CMP< CBC and CD4 cout in next few weeks and visit with me afterwards

## 2012-04-20 ENCOUNTER — Encounter: Payer: Self-pay | Admitting: Infectious Disease

## 2012-04-20 LAB — RPR

## 2012-04-20 LAB — HIV-1 RNA QUANT-NO REFLEX-BLD: HIV-1 RNA Quant, Log: <1.3 {Log} (ref <1.30)

## 2012-05-03 ENCOUNTER — Ambulatory Visit (INDEPENDENT_AMBULATORY_CARE_PROVIDER_SITE_OTHER): Payer: BC Managed Care – PPO | Admitting: Infectious Disease

## 2012-05-03 ENCOUNTER — Ambulatory Visit: Payer: BC Managed Care – PPO | Admitting: Infectious Disease

## 2012-05-03 ENCOUNTER — Encounter: Payer: Self-pay | Admitting: Infectious Disease

## 2012-05-03 VITALS — BP 150/95 | HR 79 | Temp 97.8°F | Ht 73.0 in | Wt 249.0 lb

## 2012-05-03 DIAGNOSIS — B20 Human immunodeficiency virus [HIV] disease: Secondary | ICD-10-CM

## 2012-05-03 NOTE — Progress Notes (Signed)
  Subjective:    Patient ID: Joseph Johnston, male    DOB: Feb 16, 1955, 57 y.o.   MRN: [id number]  HPI   57 year old man recently diagnosed with HIV (wild type virus) started on tivicay and truvada with undetectable VL and healthy cd4 count. He had considered switch to stribild for insurance convenience and copay issues but ended up statying on Tivicay and Truvada. All labs reveiwed and he will fu with Dr. Susann Givens for primary care issues.  Review of Systems  Constitutional: Negative for fever, chills, diaphoresis, activity change, appetite change, fatigue and unexpected weight change.  HENT: Negative for congestion, sore throat, rhinorrhea, sneezing, trouble swallowing and sinus pressure.   Eyes: Negative for photophobia and visual disturbance.  Respiratory: Negative for cough, chest tightness, shortness of breath, wheezing and stridor.   Cardiovascular: Negative for chest pain, palpitations and leg swelling.  Gastrointestinal: Negative for nausea, vomiting, abdominal pain, diarrhea, constipation, blood in stool, abdominal distention and anal bleeding.  Genitourinary: Negative for dysuria, hematuria, flank pain and difficulty urinating.  Musculoskeletal: Negative for myalgias, back pain, joint swelling, arthralgias and gait problem.  Skin: Negative for color change, pallor, rash and wound.  Neurological: Positive for headaches. Negative for dizziness, tremors, weakness and light-headedness.  Hematological: Negative for adenopathy. Does not bruise/bleed easily.  Psychiatric/Behavioral: Negative for behavioral problems, confusion, sleep disturbance, dysphoric mood, decreased concentration and agitation.       Objective:   Physical Exam  Constitutional: He is oriented to person, place, and time. He appears well-developed and well-nourished. No distress.  HENT:  Head: Normocephalic and atraumatic.  Mouth/Throat: Oropharynx is clear and moist. No oropharyngeal exudate.  Eyes: Conjunctivae and  EOM are normal. Pupils are equal, round, and reactive to light. No scleral icterus.  Neck: Normal range of motion. Neck supple. No JVD present.  Cardiovascular: Normal rate, regular rhythm and normal heart sounds.  Exam reveals no gallop and no friction rub.   No murmur heard. Pulmonary/Chest: Effort normal and breath sounds normal. No respiratory distress. He has no wheezes. He has no rales. He exhibits no tenderness.  Abdominal: He exhibits no distension and no mass. There is no tenderness. There is no rebound and no guarding.  Musculoskeletal: He exhibits no edema and no tenderness.  Lymphadenopathy:    He has no cervical adenopathy.  Neurological: He is alert and oriented to person, place, and time. He has normal reflexes. He exhibits normal muscle tone. Coordination normal.  Skin: Skin is warm and dry. He is not diaphoretic. No erythema. No pallor.  Psychiatric: He has a normal mood and affect. His behavior is normal. Judgment and thought content normal.          Assessment & Plan:   HIV: continue TIVicay and truvada.   HTN:BP up again, fu with Dr. Susann Givens, consider microalbumin creatinine ratio  Headache: resolved  HCM: Hep A vax #2 today

## 2012-05-05 ENCOUNTER — Ambulatory Visit: Payer: BC Managed Care – PPO | Admitting: Infectious Disease

## 2012-05-10 ENCOUNTER — Encounter: Payer: Self-pay | Admitting: Family Medicine

## 2012-05-10 ENCOUNTER — Ambulatory Visit (INDEPENDENT_AMBULATORY_CARE_PROVIDER_SITE_OTHER): Payer: BC Managed Care – PPO | Admitting: Family Medicine

## 2012-05-10 VITALS — BP 140/90 | HR 88 | Wt 242.0 lb

## 2012-05-10 DIAGNOSIS — M79609 Pain in unspecified limb: Secondary | ICD-10-CM

## 2012-05-10 DIAGNOSIS — K5190 Ulcerative colitis, unspecified, without complications: Secondary | ICD-10-CM

## 2012-05-10 NOTE — Progress Notes (Signed)
  Subjective:    Patient ID: Joseph Johnston, male    DOB: 1956-01-07, 57 y.o.   MRN: [id number]  HPI He is here for consult concerning both hands getting down. He states that he will wake up with this. He describes a numb feeling in his third fourth and fifth fingers. He cannot relate this to elbow or wrist or shoulder position stating he thinks his elbows are actually out straight. He also notes difficulty with numbness when he uses his computer. He describes his elbow sleeping out relatively straight with his wrist slightly bent. He cannot distinguish exactly where the numbness is when he types. He also needs a referral for gastroenterology. He does have an underlying history of ulcerative colitis and is on medication for this. His insurance changed and therefore he will need to switch to a new provider.he apparently had a colonoscopy 2 years ago.   Review of Systems     Objective:   Physical Exam He complains of decreased sensation in the ulnar nerve distribution bilaterally and partially including the middle finger medially. Normal strength.       Assessment & Plan:  Ulcerative colitis - Plan: Ambulatory referral to Gastroenterology  Hand pain, unspecified laterality referral for GI will be made to followup on his ulcerative colitis. I discussed the symptoms that he is having explaining that this could be ulnar nerve or possibly median nerve distribution. Waking up at night seem to be ulnar although he also did be having median nerve with using the computer. Encouraged him to eat more aware of his wrist and elbow position in particular is as well as any pressure on the arm. He is to let me know this works. We also discussed his underlying HIV. Apparently he will continue to be monitored by ID.

## 2012-05-19 ENCOUNTER — Telehealth: Payer: Self-pay | Admitting: Infectious Disease

## 2012-05-19 ENCOUNTER — Telehealth: Payer: Self-pay | Admitting: Family Medicine

## 2012-05-19 MED ORDER — EMTRICITABINE-TENOFOVIR DF 200-300 MG PO TABS: 1.0000 | ORAL_TABLET | Freq: Every day | ORAL | Status: DC

## 2012-05-19 NOTE — Telephone Encounter (Signed)
Pt called and req refill of Balsalazide to [facility].  He said you were waiting on report from GI.

## 2012-05-19 NOTE — Telephone Encounter (Signed)
[facility] request for Tivicay which I filled out

## 2012-05-19 NOTE — Telephone Encounter (Signed)
OK to renew 

## 2012-05-19 NOTE — Telephone Encounter (Signed)
Ok thank you 

## 2012-05-20 ENCOUNTER — Other Ambulatory Visit: Payer: Self-pay

## 2012-05-20 MED ORDER — BALSALAZIDE DISODIUM 750 MG PO CAPS: 2250.0000 mg | ORAL_CAPSULE | Freq: Two times a day (BID) | ORAL | Status: DC

## 2012-05-20 NOTE — Telephone Encounter (Signed)
SENT IN MED PER JCL 

## 2012-05-26 ENCOUNTER — Other Ambulatory Visit: Payer: Self-pay | Admitting: Licensed Clinical Social Worker

## 2012-05-26 ENCOUNTER — Other Ambulatory Visit: Payer: Self-pay | Admitting: *Deleted

## 2012-05-26 DIAGNOSIS — B20 Human immunodeficiency virus [HIV] disease: Secondary | ICD-10-CM

## 2012-05-26 MED ORDER — DOLUTEGRAVIR SODIUM 50 MG PO TABS: 50.0000 mg | ORAL_TABLET | Freq: Every day | ORAL | Status: DC

## 2012-05-26 MED ORDER — EMTRICITABINE-TENOFOVIR DF 200-300 MG PO TABS: 1.0000 | ORAL_TABLET | Freq: Every day | ORAL | Status: DC

## 2012-07-07 ENCOUNTER — Telehealth: Payer: Self-pay | Admitting: Internal Medicine

## 2012-07-07 MED ORDER — ALLOPURINOL 100 MG PO TABS: ORAL_TABLET | ORAL | Status: DC

## 2012-07-07 NOTE — Telephone Encounter (Signed)
SENT IN MED  

## 2012-07-07 NOTE — Telephone Encounter (Signed)
Refill request for allopurinol 100mg  to [facility] [address]

## 2012-08-04 ENCOUNTER — Telehealth: Payer: Self-pay

## 2012-08-04 NOTE — Telephone Encounter (Signed)
rcv'd records from [facility] forward 53 pages

## 2012-08-05 ENCOUNTER — Telehealth: Payer: Self-pay | Admitting: Internal Medicine

## 2012-08-06 ENCOUNTER — Encounter: Payer: Self-pay | Admitting: Internal Medicine

## 2012-08-06 NOTE — Telephone Encounter (Signed)
Left message for Mr Vivanco to call back & schedule,

## 2012-08-11 ENCOUNTER — Telehealth: Payer: Self-pay | Admitting: Internal Medicine

## 2012-08-11 NOTE — Telephone Encounter (Signed)
Refill request for Balsalazide 750mg  #180 to [facility] [address]

## 2012-08-12 MED ORDER — BALSALAZIDE DISODIUM 750 MG PO CAPS: 2250.0000 mg | ORAL_CAPSULE | Freq: Two times a day (BID) | ORAL | Status: DC

## 2012-08-12 NOTE — Telephone Encounter (Signed)
HE DOES HAVE APPT 7/21

## 2012-08-12 NOTE — Telephone Encounter (Signed)
Make sure he has an appointment with GI. He should be scheduled to see Dr. Marina Goodell

## 2012-08-16 ENCOUNTER — Ambulatory Visit (INDEPENDENT_AMBULATORY_CARE_PROVIDER_SITE_OTHER): Payer: BC Managed Care – PPO | Admitting: Internal Medicine

## 2012-08-16 ENCOUNTER — Encounter: Payer: Self-pay | Admitting: Internal Medicine

## 2012-08-16 VITALS — BP 110/70 | HR 100 | Ht 69.75 in | Wt 229.2 lb

## 2012-08-16 DIAGNOSIS — K5732 Diverticulitis of large intestine without perforation or abscess without bleeding: Secondary | ICD-10-CM

## 2012-08-16 DIAGNOSIS — K5190 Ulcerative colitis, unspecified, without complications: Secondary | ICD-10-CM

## 2012-08-16 DIAGNOSIS — R1032 Left lower quadrant pain: Secondary | ICD-10-CM

## 2012-08-16 MED ORDER — METRONIDAZOLE 500 MG PO TABS: 500.0000 mg | ORAL_TABLET | Freq: Two times a day (BID) | ORAL | Status: DC

## 2012-08-16 MED ORDER — CIPROFLOXACIN HCL 500 MG PO TABS: 500.0000 mg | ORAL_TABLET | Freq: Two times a day (BID) | ORAL | Status: DC

## 2012-08-16 NOTE — Patient Instructions (Addendum)
We have sent the following medications to your pharmacy for you to pick up at your convenience:  Cipro, Metronidazole  Please follow up with Dr. Marina Goodell in 2 weeks (week of 08-30-12)

## 2012-08-16 NOTE — Progress Notes (Signed)
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 He presents today, switching his GI care, for insurance reasons. He is referred by his PCP. He was previously under the care of Dr. Nance Pew, and [place]. I have reviewed more than 50 pages of documents from the office. As best I can tell, the patient underwent index colonoscopy 11/15/2009 because of a family history of colon cancer in his brother. The examination was said to show diminutive polyps as well as erosions and ulcerations in the sigmoid colon consistent with ulcerative colitis. Review of pathology of biopsies from the rectum in 2 areas, as well as sigmoid and descending colon, revealed hyperplastic changes only. He was given the diagnosis of ulcerative colitis and placed on different mesalamine agents. Currently on balsalazide 2.25 g twice a day. He apparently underwent a followup colonoscopy October 2012. Biopsy at 20 cm revealed hyperplastic polyp. Biopsy at 45 cm was said to show "quiescent colitis"and "no evidence for acute colitis or microscopic colitis or dysplasia". Review of other records shows abdominal ultrasound from December 2011 revealing fatty liver. A CT scan from September 2011 revealed sigmoid diverticulosis. Review of laboratories from March 2014 reveal normal CBC. Normal comprehensive metabolic panel. GI review of systems is otherwise negative He reports to me that he was well until 2 days ago when he developed left lower quadrant discomfort. This has been focal but somewhat progressive. Discomfort is exacerbated by jarring movements. No obvious fevers.  REVIEW OF SYSTEMS:  All non-GI ROS negative upon review  Past Medical History  Diagnosis Date  . Gout   . 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 Seborrheic dermatitis   . Ulcerative colitis   .  Hyperlipidemia   . HIV (human immunodeficiency virus infection)   . Dyslipidemia   . Hx of colonic polyps   . Allergic rhinitis   . Hypercholesterolemia   . Restless leg     Past Surgical History  Procedure Laterality Date  . Left shoulder surgery    . Colonoscopy  11/26/09    Social History Joseph Johnston  reports that he quit smoking about 12 years ago. He has never used smokeless tobacco. He reports that he does not drink alcohol or use illicit drugs.  family history includes Colon cancer in his brother; Colon polyps in his mother and sister; Coronary artery disease (age of onset: 31) in his sister; Diabetes in his brother, father, mother, and sister; Hyperlipidemia in his brother, father, mother, and sister; and Hypertension in his brother, father, and mother.  Allergies  Allergen Reactions  . Doxycycline   . Penicillins     REACTION: rash/hives       PHYSICAL EXAMINATION: Vital signs: BP 110/70  Pulse 100  Ht 5' 9.75" (1.772 m)  Wt 229 lb 4 oz (103.987 kg)  BMI 33.12 kg/m2  Constitutional: generally well-appearing, no acute distress Psychiatric: alert and oriented x3, cooperative Eyes: extraocular movements intact, anicteric, conjunctiva pink Mouth: oral pharynx moist, no lesions Neck: supple no lymphadenopathy Cardiovascular: heart regular rate and rhythm, no murmur Lungs: clear to auscultation bilaterally Abdomen: soft,  focal tenderness in left lower quadrant with mild rebound,  nondistended, no obvious ascites, no peritoneal signs, normal bowel sounds, no organomegaly Rectal: omitted  Extremities: no lower extremity edema bilaterally Skin: no lesions on visible extremities Neuro: No  focal deficits.    ASSESSMENT:  #1. Acute diverticulitis. This is most likely cause for his new persistent left lower quadrant pain #2. Question of ulcerative colitis. Not clear to me that he truly has inflammatory bowel disease. Possibly diverticular colitis. Last colonoscopy  2012 #3. Family history of colon cancer in his brother. #4. Personal history of hyperplastic polyps #5. HIV disease. In remission on therapy    PLAN:  #1. Ciprofloxacin 500 mg twice a day x10 days #2. Metronidazole 500 mg twice a day x10 days #3. Low-residue diet. Advance tolerated #4. Continue 5-ASA agent for now #5. Have our pathology department review of his outside slides. #6. GI followup in 2 weeks

## 2012-08-26 ENCOUNTER — Other Ambulatory Visit: Payer: Self-pay | Admitting: Internal Medicine

## 2012-09-01 ENCOUNTER — Ambulatory Visit (INDEPENDENT_AMBULATORY_CARE_PROVIDER_SITE_OTHER): Payer: BC Managed Care – PPO | Admitting: Internal Medicine

## 2012-09-01 ENCOUNTER — Encounter: Payer: Self-pay | Admitting: Internal Medicine

## 2012-09-01 VITALS — BP 132/90 | HR 80 | Ht 69.75 in | Wt 231.8 lb

## 2012-09-01 DIAGNOSIS — K5732 Diverticulitis of large intestine without perforation or abscess without bleeding: Secondary | ICD-10-CM

## 2012-09-01 DIAGNOSIS — Z800 Family history of malignant neoplasm of digestive organs: Secondary | ICD-10-CM

## 2012-09-01 DIAGNOSIS — K5190 Ulcerative colitis, unspecified, without complications: Secondary | ICD-10-CM

## 2012-09-01 NOTE — Progress Notes (Signed)
HISTORY OF PRESENT ILLNESS:  Joseph Johnston is a 57 y.o. male with HIV and ulcerative colitis who is seen 08/16/2012 to establish as a new patient. See that detailed dictation. At that time, he presented with acute diverticulitis. He was treated with Cipro and Flagyl. His symptoms resolved for 5 days. No recurrence. He is pleased. We did review outside pathology slides formally. Quiescent ulcerative colitis confirm. He continues on balsalazide 2.25 g twice a day. No complaints this time. His last colonoscopy was October 2004. There is a family history of colon cancer in his brother.  REVIEW OF SYSTEMS:  All non-GI ROS negative   Past Medical History  Diagnosis Date  . Gout   . 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 Seborrheic dermatitis   . Ulcerative colitis   . Hyperlipidemia   . HIV (human immunodeficiency virus infection)   . Dyslipidemia   . Hx of colonic polyps   . Allergic rhinitis   . Hypercholesterolemia   . Restless leg   . Diverticulitis     Past Surgical History  Procedure Laterality Date  . Left shoulder surgery    . Colonoscopy  11/26/09    Social History Joseph Johnston  reports that he quit smoking about 12 years ago. He has never used smokeless tobacco. He reports that he does not drink alcohol or use illicit drugs.  family history includes Colon cancer in his brother; Colon polyps in his mother and sister; Coronary artery disease (age of onset: 26) in his sister; Diabetes in his brother, father, mother, and sister; Hyperlipidemia in his brother, father, mother, and sister; and Hypertension in his brother, father, and mother.  Allergies  Allergen Reactions  . Doxycycline   . Penicillins     REACTION: rash/hives       PHYSICAL EXAMINATION: Vital signs: BP 132/90  Pulse 80  Ht 5' 9.75" (1.772 m)  Wt 231 lb 12.8 oz (105.144 kg)  BMI 33.49 kg/m2 General: Well-developed, well-nourished, no acute distress HEENT: Sclerae are anicteric, conjunctiva pink. Oral mucosa  intact Lungs: Clear Heart: Regular Abdomen: soft, nontender, nondistended, no obvious ascites, no peritoneal signs, normal bowel sounds. No organomegaly. Extremities: No edema Psychiatric: alert and oriented x3. Cooperative   ASSESSMENT:  #1. Acute diverticulitis. Resolved after treatment with Cipro and Flagyl #2. Quiescent ulcerative colitis. On balsalazide #3. Family history of colon cancer in his brother #4. HIV. In remission on therapy.  PLAN:  #1. Continue balsalazide #2. Routine office followup in 6 months #3. Surveillance colonoscopy around October 2017

## 2012-09-01 NOTE — Patient Instructions (Addendum)
Please follow up with Dr. Perry in 6 months 

## 2012-10-08 ENCOUNTER — Other Ambulatory Visit (INDEPENDENT_AMBULATORY_CARE_PROVIDER_SITE_OTHER): Payer: BC Managed Care – PPO

## 2012-10-08 ENCOUNTER — Other Ambulatory Visit: Payer: Self-pay | Admitting: Family Medicine

## 2012-10-08 DIAGNOSIS — Z23 Encounter for immunization: Secondary | ICD-10-CM

## 2012-10-09 ENCOUNTER — Other Ambulatory Visit: Payer: Self-pay | Admitting: Family Medicine

## 2012-10-11 NOTE — Telephone Encounter (Signed)
Is this okay to refill? 

## 2012-11-03 ENCOUNTER — Other Ambulatory Visit (INDEPENDENT_AMBULATORY_CARE_PROVIDER_SITE_OTHER): Payer: BC Managed Care – PPO

## 2012-11-03 DIAGNOSIS — B20 Human immunodeficiency virus [HIV] disease: Secondary | ICD-10-CM

## 2012-11-03 LAB — COMPLETE METABOLIC PANEL WITH GFR
AST: 26 U/L (ref 0–37)
Albumin: 4.6 g/dL (ref 3.5–5.2)
BUN: 15 mg/dL (ref 6–23)
Calcium: 9.6 mg/dL (ref 8.4–10.5)
Chloride: 104 mEq/L (ref 96–112)
GFR, Est Non African American: 85 mL/min
Glucose, Bld: 99 mg/dL (ref 70–99)
Potassium: 4.4 mEq/L (ref 3.5–5.3)

## 2012-11-03 LAB — CBC WITH DIFFERENTIAL/PLATELET
HCT: 44.2 % (ref 39.0–52.0)
Hemoglobin: 15.9 g/dL (ref 13.0–17.0)
Lymphocytes Relative: 54 % — ABNORMAL HIGH (ref 12–46)
Lymphs Abs: 3.3 10*3/uL (ref 0.7–4.0)
Monocytes Absolute: 0.3 10*3/uL (ref 0.1–1.0)
Monocytes Relative: 5 % (ref 3–12)
Neutro Abs: 2.2 10*3/uL (ref 1.7–7.7)
WBC: 6.1 10*3/uL (ref 4.0–10.5)

## 2012-11-03 LAB — LIPID PANEL: LDL Cholesterol: 151 mg/dL — ABNORMAL HIGH (ref 0–99)

## 2012-11-04 LAB — T-HELPER CELL (CD4) - (RCID CLINIC ONLY)
CD4 % Helper T Cell: 35 % (ref 33–55)
CD4 T Cell Abs: 1230 /uL (ref 400–2700)

## 2012-11-04 LAB — HIV-1 RNA QUANT-NO REFLEX-BLD: HIV 1 RNA Quant: <20 copies/mL (ref <20)

## 2012-11-15 ENCOUNTER — Telehealth: Payer: Self-pay | Admitting: Family Medicine

## 2012-11-15 MED ORDER — TERAZOSIN HCL 2 MG PO CAPS: ORAL_CAPSULE | ORAL | Status: DC

## 2012-11-15 NOTE — Telephone Encounter (Signed)
Hytrin renewed

## 2012-11-22 ENCOUNTER — Other Ambulatory Visit: Payer: Self-pay

## 2012-11-22 ENCOUNTER — Ambulatory Visit: Payer: BC Managed Care – PPO | Admitting: Infectious Disease

## 2012-11-22 MED ORDER — BALSALAZIDE DISODIUM 750 MG PO CAPS: 2250.0000 mg | ORAL_CAPSULE | Freq: Two times a day (BID) | ORAL | Status: DC

## 2012-11-22 NOTE — Telephone Encounter (Signed)
SENT IN BALSALAZIDE PER FAX

## 2012-11-24 ENCOUNTER — Encounter: Payer: Self-pay | Admitting: Infectious Disease

## 2012-11-24 ENCOUNTER — Ambulatory Visit (INDEPENDENT_AMBULATORY_CARE_PROVIDER_SITE_OTHER): Payer: BC Managed Care – PPO | Admitting: Infectious Disease

## 2012-11-24 VITALS — BP 147/84 | HR 70 | Temp 98.2°F | Ht 71.0 in | Wt 230.0 lb

## 2012-11-24 DIAGNOSIS — R351 Nocturia: Secondary | ICD-10-CM

## 2012-11-24 DIAGNOSIS — I10 Essential (primary) hypertension: Secondary | ICD-10-CM

## 2012-11-24 DIAGNOSIS — B20 Human immunodeficiency virus [HIV] disease: Secondary | ICD-10-CM

## 2012-11-24 DIAGNOSIS — Z23 Encounter for immunization: Secondary | ICD-10-CM

## 2012-11-24 MED ORDER — ABACAVIR-DOLUTEGRAVIR-LAMIVUD 600-50-300 MG PO TABS: 1.0000 | ORAL_TABLET | Freq: Every day | ORAL | Status: DC

## 2012-11-24 NOTE — Progress Notes (Signed)
  Subjective:    Patient ID: Joseph Johnston, male    DOB: 1955-12-11, 57 y.o.   MRN: [id number]  HPI   57 year old man recently diagnosed with HIV (wild type virus) started on tivicay and truvada with undetectable VL and healthy cd4 count. He had considered switch to stribild for insurance convenience and copay issues but ended up statying on Tivicay and Truvada. Today we decided with is HLA B701 being negative to change him to [facility].    Review of Systems  Constitutional: Negative for fever, chills, diaphoresis, activity change, appetite change, fatigue and unexpected weight change.  HENT: Negative for congestion, rhinorrhea, sinus pressure, sneezing, sore throat and trouble swallowing.   Eyes: Negative for photophobia and visual disturbance.  Respiratory: Negative for cough, chest tightness, shortness of breath, wheezing and stridor.   Cardiovascular: Negative for chest pain, palpitations and leg swelling.  Gastrointestinal: Negative for nausea, vomiting, abdominal pain, diarrhea, constipation, blood in stool, abdominal distention and anal bleeding.  Genitourinary: Negative for dysuria, hematuria, flank pain and difficulty urinating.  Musculoskeletal: Negative for arthralgias, back pain, gait problem, joint swelling and myalgias.  Skin: Negative for color change, pallor, rash and wound.  Neurological: Positive for headaches. Negative for dizziness, tremors, weakness and light-headedness.  Hematological: Negative for adenopathy. Does not bruise/bleed easily.  Psychiatric/Behavioral: Negative for behavioral problems, confusion, sleep disturbance, dysphoric mood, decreased concentration and agitation.       Objective:   Physical Exam  Constitutional: He is oriented to person, place, and time. He appears well-developed and well-nourished. No distress.  HENT:  Head: Normocephalic and atraumatic.  Mouth/Throat: Oropharynx is clear and moist. No oropharyngeal exudate.  Eyes: Conjunctivae  and EOM are normal. Pupils are equal, round, and reactive to light. No scleral icterus.  Neck: Normal range of motion. Neck supple. No JVD present.  Cardiovascular: Normal rate, regular rhythm and normal heart sounds.  Exam reveals no gallop and no friction rub.   No murmur heard. Pulmonary/Chest: Effort normal and breath sounds normal. No respiratory distress. He has no wheezes. He has no rales. He exhibits no tenderness.  Abdominal: He exhibits no distension and no mass. There is no tenderness. There is no rebound and no guarding.  Musculoskeletal: He exhibits no edema and no tenderness.  Lymphadenopathy:    He has no cervical adenopathy.  Neurological: He is alert and oriented to person, place, and time. He has normal reflexes. He exhibits normal muscle tone. Coordination normal.  Skin: Skin is warm and dry. He is not diaphoretic. No erythema. No pallor.  Psychiatric: He has a normal mood and affect. His behavior is normal. Judgment and thought content normal.          Assessment & Plan:   HIV: change to TRIUMEQ (if insurance does not pay for this then back to Tivicay and Truvada for the meantime)  HTN: check microalbumin creatinine ratio, he will likely need something as his home BP measurements have also continued to be high  HCM: Hep A vax #2 today (though greater than in 6 months out and recheck titers before next blood draw.  Nocturia: on hytrin

## 2012-11-26 NOTE — Addendum Note (Signed)
Addended by: Andree Coss on: 11/26/2012 12:41 PM   Modules accepted: Orders

## 2012-12-10 ENCOUNTER — Other Ambulatory Visit: Payer: Self-pay | Admitting: Licensed Clinical Social Worker

## 2012-12-10 DIAGNOSIS — B20 Human immunodeficiency virus [HIV] disease: Secondary | ICD-10-CM

## 2012-12-16 ENCOUNTER — Ambulatory Visit (INDEPENDENT_AMBULATORY_CARE_PROVIDER_SITE_OTHER): Payer: BC Managed Care – PPO | Admitting: Family Medicine

## 2012-12-16 ENCOUNTER — Telehealth: Payer: Self-pay | Admitting: Internal Medicine

## 2012-12-16 ENCOUNTER — Encounter: Payer: Self-pay | Admitting: Family Medicine

## 2012-12-16 VITALS — BP 110/70 | HR 114 | Wt 233.0 lb

## 2012-12-16 DIAGNOSIS — L600 Ingrowing nail: Secondary | ICD-10-CM

## 2012-12-16 DIAGNOSIS — L601 Onycholysis: Secondary | ICD-10-CM

## 2012-12-16 DIAGNOSIS — L608 Other nail disorders: Secondary | ICD-10-CM

## 2012-12-16 DIAGNOSIS — G44009 Cluster headache syndrome, unspecified, not intractable: Secondary | ICD-10-CM

## 2012-12-16 MED ORDER — SUMATRIPTAN 20 MG/ACT NA SOLN: NASAL | Status: DC

## 2012-12-16 MED ORDER — SUMATRIPTAN 20 MG/ACT NA SOLN: 20.0000 mg | NASAL | Status: DC | PRN

## 2012-12-16 NOTE — Telephone Encounter (Signed)
Pt med was printed while pt was here. Pt wanted med to go to [facility]

## 2012-12-16 NOTE — Progress Notes (Signed)
Teaching Physician: Sharlot Gowda, MD Dictated By: Judithann Graves  Subjective:  Joseph Johnston is a 57 y.o. male who presents due to concerns regarding recent headaches and toe pain. Around 7 months ago he began to have severe headaches in which he felt sudden pain over the Right posterior neck which spread over his Right ear and behind his Right eye. This would linger for about 2 hours and go away. He had these headaches 1 time per week and they typically occurred at 2pm each time. Over the last three weeks they have became increasingly frequent, happening everyday at 2 pm typically, lingering for 2 hours, resolving only to recur at around 2 am each night, waking him. He has tried NSAIDs with no success. He is able to return to sleep and wakes with a dull, diffuse headache. When they occur he has photophobia, and Right-sided tinnitus. He has no phonophobia, tearing, nausea, vomiting, vertigo, hyperhidrosis, miosis. He drinks no alcohol and does not smoke.  Three weeks ago he began to notice a whitish discoloration of the nail of his Right great toe. Last week it became exquisitely tender to the touch and swollen around the nail. He has tried using over the counter topical antifungal medication twice daily for the last two weeks. It seems to worsen each time he applies this.  ROS as in subjective.   Objective: Filed Vitals:   12/16/12 1422  BP: 110/70  Pulse: 114    Physical Exam:  General: Alert and in no distress  CN: EOMI, PERRLA HEENT: No papilledema. Tympanic membranes and canals are normal. Throat is clear. Tonsils are normal. No TTP over TMJ.  Neck: Supple without adenopathy or thyromegaly. CV: Regular sinus rhythm without murmurs or gallops  Puml: Lungs are clear to auscultation  Ext: Right great toe with onycholysis and some surrounding erythema.  Assessment and Plan: 1. Cluster headaches In spite of the absence of clear concurrent ANS symptoms during attack, the consistent  timing during the day, severity, and unilaterality all point to the possibility that these represent cluster headaches. Will initiate IN Sumatriptan 20mg /ACT for abortive therapy. RTC for reevaluation if headaches fail to respond.  2. Onycholysis Will manage expectantly.  3. Unguis incarnatus Managing expectantly.   Dr. Susann Givens was present for the encounter and agrees with the above assessment and plan.

## 2012-12-22 ENCOUNTER — Other Ambulatory Visit: Payer: Self-pay | Admitting: *Deleted

## 2012-12-22 DIAGNOSIS — B20 Human immunodeficiency virus [HIV] disease: Secondary | ICD-10-CM

## 2012-12-22 MED ORDER — DOLUTEGRAVIR SODIUM 50 MG PO TABS: 50.0000 mg | ORAL_TABLET | Freq: Every day | ORAL | Status: DC

## 2012-12-22 MED ORDER — EMTRICITABINE-TENOFOVIR DF 200-300 MG PO TABS: 1.0000 | ORAL_TABLET | Freq: Every day | ORAL | Status: DC

## 2012-12-28 ENCOUNTER — Ambulatory Visit (INDEPENDENT_AMBULATORY_CARE_PROVIDER_SITE_OTHER): Payer: BC Managed Care – PPO | Admitting: Family Medicine

## 2012-12-28 DIAGNOSIS — L600 Ingrowing nail: Secondary | ICD-10-CM

## 2012-12-28 NOTE — Progress Notes (Signed)
   Subjective:    Patient ID: Joseph Johnston, male    DOB: [birth year], 57 y.o.   MRN: [id number]  HPI He continues to have difficulty with right great toe pain. This has been bothering him since his last visit and is now quite painful.   Review of Systems     Objective:   Physical Exam The lateral aspect of the right great toenail is red and quite tender to palpation.       Assessment & Plan:  Unguis incarnatus  since it is bothering him to this extent, I will set him up for partial nail excision. He also plans to switch his HIV care over to me.

## 2012-12-31 ENCOUNTER — Ambulatory Visit (INDEPENDENT_AMBULATORY_CARE_PROVIDER_SITE_OTHER): Payer: BC Managed Care – PPO | Admitting: Family Medicine

## 2012-12-31 DIAGNOSIS — B351 Tinea unguium: Secondary | ICD-10-CM

## 2012-12-31 DIAGNOSIS — L600 Ingrowing nail: Secondary | ICD-10-CM

## 2012-12-31 MED ORDER — HYDROCODONE-ACETAMINOPHEN 5-325 MG PO TABS: 1.0000 | ORAL_TABLET | Freq: Four times a day (QID) | ORAL | Status: DC | PRN

## 2012-12-31 NOTE — Progress Notes (Signed)
   Subjective:    Patient ID: Joseph Johnston, male    DOB: Jan 29, 1955, 57 y.o.   MRN: [id number]  HPI He is here for partial nail excision for treatment of fungus in her not to use. He has had difficulty with this for the last 3-4 weeks.   Review of Systems     Objective:   Physical Exam Exam of the right great toe does show erythema and tenderness to palpation along the lateral aspect of the great toenail.       Assessment & Plan:  Unguis incarnatus  a digital block was performed with Xylocaine. Adequate hemostasis was obtained. The partial excision of the toenail was done without difficulty. The base was debrided and cauterized with silver nitrate. Packing was applied. He was given hydrocodone 5/325 for pain relief. He was given proper instructions on care of the wound.

## 2012-12-31 NOTE — Patient Instructions (Signed)
On Sunday soak the dressing off and then put a Band-Aid on it. Soak it several times per day

## 2013-01-18 ENCOUNTER — Ambulatory Visit (INDEPENDENT_AMBULATORY_CARE_PROVIDER_SITE_OTHER): Payer: Federal, State, Local not specified - PPO | Admitting: Family Medicine

## 2013-01-18 ENCOUNTER — Encounter: Payer: Self-pay | Admitting: Family Medicine

## 2013-01-18 VITALS — BP 116/80 | HR 101 | Wt 238.0 lb

## 2013-01-18 DIAGNOSIS — B20 Human immunodeficiency virus [HIV] disease: Secondary | ICD-10-CM

## 2013-01-18 DIAGNOSIS — M109 Gout, unspecified: Secondary | ICD-10-CM

## 2013-01-18 DIAGNOSIS — G2581 Restless legs syndrome: Secondary | ICD-10-CM

## 2013-01-18 MED ORDER — DOLUTEGRAVIR SODIUM 50 MG PO TABS: 50.0000 mg | ORAL_TABLET | Freq: Every day | ORAL | Status: DC

## 2013-01-18 MED ORDER — ROPINIROLE HCL 0.25 MG PO TABS: ORAL_TABLET | ORAL | Status: DC

## 2013-01-18 MED ORDER — EMTRICITABINE-TENOFOVIR DF 200-300 MG PO TABS: 1.0000 | ORAL_TABLET | Freq: Every day | ORAL | Status: DC

## 2013-01-18 MED ORDER — ALLOPURINOL 100 MG PO TABS: ORAL_TABLET | ORAL | Status: DC

## 2013-01-18 NOTE — Progress Notes (Signed)
   Subjective:    Patient ID: Joseph Johnston, male    DOB: 07-14-1955, 57 y.o.   MRN: [id number]  HPI Or consultation. He plans to switch over to my care for his HIV care. He is switching to a different insurance plan and will need to prescriptions written. He also has a history of gout and is on allopurinol for that and uses Requip for his RLS. He needs these prescriptions rewritten as well. Last blood work concerning his HIV was done in October.   Review of Systems     Objective:   Physical Exam Alert and in no distress otherwise not examined       Assessment & Plan:  Human immunodeficiency virus (HIV) disease - Plan: dolutegravir (TIVICAY) 50 MG tablet  Human immunodeficiency virus (HIV) disease - Plan: emtricitabine-tenofovir (TRUVADA) 200-300 MG per tablet  Gout - Plan: allopurinol (ZYLOPRIM) 100 MG tablet  RLS (restless legs syndrome) - Plan: rOPINIRole (REQUIP) 0.25 MG tablet  prescriptions for written for mail order for his HIV disease and allopurinol and Requip were called in locally.

## 2013-01-24 ENCOUNTER — Telehealth: Payer: Self-pay | Admitting: *Deleted

## 2013-01-24 NOTE — Telephone Encounter (Signed)
Received fax from pharmacy regarding rx for [facility].  RN saw in chart that patient has switched his HIV management to Dr. Susann Givens.  Removed Dr. Daiva Eves from his care team, notified practice manager. Andree Coss, RN

## 2013-01-31 NOTE — Telephone Encounter (Signed)
Very good,

## 2013-02-07 ENCOUNTER — Other Ambulatory Visit: Payer: Self-pay | Admitting: Family Medicine

## 2013-02-08 NOTE — Telephone Encounter (Signed)
Is this okay to refill? 

## 2013-02-28 ENCOUNTER — Telehealth: Payer: Self-pay | Admitting: Internal Medicine

## 2013-02-28 ENCOUNTER — Ambulatory Visit (INDEPENDENT_AMBULATORY_CARE_PROVIDER_SITE_OTHER): Payer: Federal, State, Local not specified - PPO | Admitting: Family Medicine

## 2013-02-28 VITALS — BP 150/100 | HR 94 | Wt 242.0 lb

## 2013-02-28 DIAGNOSIS — M67919 Unspecified disorder of synovium and tendon, unspecified shoulder: Secondary | ICD-10-CM

## 2013-02-28 DIAGNOSIS — M719 Bursopathy, unspecified: Secondary | ICD-10-CM

## 2013-02-28 DIAGNOSIS — M7581 Other shoulder lesions, right shoulder: Secondary | ICD-10-CM

## 2013-02-28 DIAGNOSIS — B20 Human immunodeficiency virus [HIV] disease: Secondary | ICD-10-CM

## 2013-02-28 MED ORDER — TRIAMCINOLONE ACETONIDE 40 MG/ML IJ SUSP
40.0000 mg | Freq: Once | INTRAMUSCULAR | Status: AC
  Administered 2013-02-28: 40 mg via INTRAMUSCULAR

## 2013-02-28 MED ORDER — LIDOCAINE-EPINEPHRINE (PF) 1 %-1:200000 IJ SOLN: 20.0000 mL | Freq: Once | INTRAMUSCULAR | Status: DC

## 2013-02-28 MED ORDER — LIDOCAINE HCL (PF) 1 % IJ SOLN
2.0000 mL | Freq: Once | INTRAMUSCULAR | Status: AC
  Administered 2013-02-28: 2 mL via INTRADERMAL

## 2013-02-28 NOTE — Telephone Encounter (Signed)
When pt checked out. He wants to see if he could make a 6 month lab appointment to get his labs for his HIV checked. He is switching from Dr. Tommy Medal to you and his last labs were 10/8. Does he need to come see you first or can he just do labs. If ok for labs please put in orders. Notify pt

## 2013-02-28 NOTE — Telephone Encounter (Signed)
Per [employer]. Pt is to be seen for a 53month follow in April since pt last saw Dr. Lucianne Lei dam in October. Pt will also have labs at the time of visit in april

## 2013-02-28 NOTE — Progress Notes (Signed)
   Subjective:    Patient ID: Joseph Johnston, male    DOB: 05-16-1955, 58 y.o.   MRN: [id number]  HPI He has a four-month history of right shoulder pain that made worse when he abducts and externally rotates. He has no history of injury. No popping, locking or grinding. He has had previous left shoulder laparoscopic surgery and thinks that it is the same thing on the opposite shoulder. He also has an underlying history of HIV and would like to switch his care back to me for this.  Review of Systems     Objective:   Physical Exam Right shoulder exam shows full motion. No crepitus noted. Supraspinatus testing was uncomfortable but intact. Near his and Luan Pulling test was uncomfortable.       Assessment & Plan:  Tendinitis of right rotator cuff - Plan: lidocaine (PF) (XYLOCAINE) 1 % injection 2 mL, triamcinolone acetonide (KENALOG-40) injection 40 mg, DISCONTINUED: lidocaine-EPINEPHrine (XYLOCAINE-EPINEPHrine) 1 %-1:200000 (PF) injection 20 mL  Human immunodeficiency virus (HIV) disease  he did obtain relief with the shoulder injection. I will also reorder his blood work for his next visit in several months for the HIV.

## 2013-05-05 ENCOUNTER — Telehealth: Payer: Self-pay | Admitting: Internal Medicine

## 2013-05-05 MED ORDER — TERAZOSIN HCL 2 MG PO CAPS: ORAL_CAPSULE | ORAL | Status: DC

## 2013-05-05 NOTE — Telephone Encounter (Signed)
Medication sent in. 

## 2013-05-05 NOTE — Telephone Encounter (Signed)
Refill request for hytrin 2mg  to [facility]

## 2013-05-10 ENCOUNTER — Ambulatory Visit: Payer: Federal, State, Local not specified - PPO | Admitting: Family Medicine

## 2013-05-17 ENCOUNTER — Encounter: Payer: Self-pay | Admitting: Family Medicine

## 2013-05-17 ENCOUNTER — Ambulatory Visit (INDEPENDENT_AMBULATORY_CARE_PROVIDER_SITE_OTHER): Payer: Federal, State, Local not specified - PPO | Admitting: Family Medicine

## 2013-05-17 VITALS — BP 132/90 | HR 84 | Wt 239.0 lb

## 2013-05-17 DIAGNOSIS — B20 Human immunodeficiency virus [HIV] disease: Secondary | ICD-10-CM

## 2013-05-17 DIAGNOSIS — R5381 Other malaise: Secondary | ICD-10-CM

## 2013-05-17 DIAGNOSIS — R5383 Other fatigue: Secondary | ICD-10-CM

## 2013-05-17 DIAGNOSIS — G2581 Restless legs syndrome: Secondary | ICD-10-CM

## 2013-05-17 DIAGNOSIS — I10 Essential (primary) hypertension: Secondary | ICD-10-CM

## 2013-05-17 DIAGNOSIS — G44009 Cluster headache syndrome, unspecified, not intractable: Secondary | ICD-10-CM

## 2013-05-17 DIAGNOSIS — E785 Hyperlipidemia, unspecified: Secondary | ICD-10-CM

## 2013-05-17 DIAGNOSIS — E669 Obesity, unspecified: Secondary | ICD-10-CM

## 2013-05-17 LAB — COMPREHENSIVE METABOLIC PANEL
ALT: 30 U/L (ref 0–53)
AST: 30 U/L (ref 0–37)
Albumin: 4.8 g/dL (ref 3.5–5.2)
Alkaline Phosphatase: 91 U/L (ref 39–117)
BILIRUBIN TOTAL: 0.5 mg/dL (ref 0.2–1.2)
BUN: 9 mg/dL (ref 6–23)
CO2: 25 meq/L (ref 19–32)
Calcium: 9.8 mg/dL (ref 8.4–10.5)
Chloride: 103 mEq/L (ref 96–112)
Creat: 0.94 mg/dL (ref 0.50–1.35)
GLUCOSE: 92 mg/dL (ref 70–99)
Potassium: 4.2 mEq/L (ref 3.5–5.3)
SODIUM: 137 meq/L (ref 135–145)
TOTAL PROTEIN: 7.3 g/dL (ref 6.0–8.3)

## 2013-05-17 LAB — CBC WITH DIFFERENTIAL/PLATELET
Basophils Absolute: 0 10*3/uL (ref 0.0–0.1)
Basophils Relative: 0 % (ref 0–1)
EOS ABS: 0.1 10*3/uL (ref 0.0–0.7)
EOS PCT: 1 % (ref 0–5)
HEMATOCRIT: 44.5 % (ref 39.0–52.0)
HEMOGLOBIN: 16.4 g/dL (ref 13.0–17.0)
LYMPHS ABS: 2.8 10*3/uL (ref 0.7–4.0)
Lymphocytes Relative: 48 % — ABNORMAL HIGH (ref 12–46)
MCH: 33.4 pg (ref 26.0–34.0)
MCHC: 36.9 g/dL — AB (ref 30.0–36.0)
MCV: 90.6 fL (ref 78.0–100.0)
MONO ABS: 0.3 10*3/uL (ref 0.1–1.0)
MONOS PCT: 5 % (ref 3–12)
NEUTROS PCT: 46 % (ref 43–77)
Neutro Abs: 2.7 10*3/uL (ref 1.7–7.7)
Platelets: 294 10*3/uL (ref 150–400)
RBC: 4.91 MIL/uL (ref 4.22–5.81)
RDW: 13.8 % (ref 11.5–15.5)
WBC: 5.8 10*3/uL (ref 4.0–10.5)

## 2013-05-17 LAB — LIPID PANEL
CHOLESTEROL: 226 mg/dL — AB (ref 0–200)
HDL: 38 mg/dL — AB (ref >39)
LDL Cholesterol: 148 mg/dL — ABNORMAL HIGH (ref 0–99)
TRIGLYCERIDES: 201 mg/dL — AB (ref <150)
Total CHOL/HDL Ratio: 5.9 Ratio
VLDL: 40 mg/dL (ref 0–40)

## 2013-05-17 NOTE — Progress Notes (Signed)
   Subjective:    Patient ID: Joseph Johnston, male    DOB: [birth year], 58 y.o.   MRN: [id number]  HPI He is here for a recheck. He continues on his HIV medications and is having no difficulty with them. No fever, chills, nausea, abdominal pain, chest congestion or coughing. He states that his shoulder is now back to normal. Does have some concerns over his weight. He also complains of continued difficulty with intermittent fatigue but no skin or hair changes. He has a desire but does not have the ability to do this. His work is going well. His home life is stable. His RLS symptoms are under control. He does have a history of sleep apnea but not to the extent of needing CPAP. He also has a previous history of cluster headaches but has not had any difficulty with headaches recently.   Review of Systems  All other systems reviewed and are negative.      Objective:   Physical Exam alert and in no distress. Tympanic membranes and canals are normal. Throat is clear. Tonsils are normal. Neck is supple without adenopathy or thyromegaly. Cardiac exam shows a regular sinus rhythm without murmurs or gallops. Lungs are clear to auscultation. Abdominal exam shows no masses or tenderness. No axillary or inguinal adenopathy.       Assessment & Plan:  HTN (hypertension) - Plan: CBC with Differential, Comprehensive metabolic panel  Human immunodeficiency virus (HIV) disease - Plan: CBC with Differential, Comprehensive metabolic panel, Lipid panel, HIV 1 RNA quant-no reflex-bld, T-helper cells (CD4) count, CANCELED: T-helper cells (CD4) count  Hyperlipidemia LDL goal < 100 - Plan: Lipid panel  RLS (restless legs syndrome)  Cluster headaches  Obesity (BMI 30-39.9) - Plan: CBC with Differential, Comprehensive metabolic panel, Lipid panel, Amb ref to Medical Nutrition Therapy-MNT  Fatigue - Plan: TSH, Testosterone  continue present medications. I will check testosterone and TSH to evaluate his fatigue.  We'll also refer to nutritionist. Discussed the possibility of medication to help with weight loss after he has been to the nutritionist.

## 2013-05-18 LAB — HIV-1 RNA QUANT-NO REFLEX-BLD
HIV 1 RNA Quant: <20 copies/mL (ref <20)
HIV-1 RNA Quant, Log: <1.3 {Log} (ref <1.30)

## 2013-05-18 LAB — T-HELPER CELLS (CD4) COUNT (NOT AT ARMC)
Absolute CD4: 1086 /uL (ref 381–1469)
CD4 T Helper %: 39 % (ref 32–62)
TOTAL LYMPHOCYTE COUNT: 2784 /uL (ref 700–3300)
TOTAL LYMPHOCYTE: 48 % — AB (ref 12–46)
WBC, lymph enumeration: 5.8 10*3/uL (ref 4.0–10.5)

## 2013-05-18 LAB — TSH: TSH: 1.72 u[IU]/mL (ref 0.350–4.500)

## 2013-05-18 LAB — TESTOSTERONE: Testosterone: 336 ng/dL (ref 300–890)

## 2013-06-29 ENCOUNTER — Encounter: Payer: Self-pay | Admitting: Family Medicine

## 2013-06-29 ENCOUNTER — Ambulatory Visit (INDEPENDENT_AMBULATORY_CARE_PROVIDER_SITE_OTHER): Payer: Federal, State, Local not specified - PPO | Admitting: Family Medicine

## 2013-06-29 VITALS — BP 110/90 | HR 60 | Wt 234.0 lb

## 2013-06-29 DIAGNOSIS — R39198 Other difficulties with micturition: Secondary | ICD-10-CM

## 2013-06-29 DIAGNOSIS — R3989 Other symptoms and signs involving the genitourinary system: Secondary | ICD-10-CM

## 2013-06-29 DIAGNOSIS — N419 Inflammatory disease of prostate, unspecified: Secondary | ICD-10-CM

## 2013-06-29 LAB — POCT URINALYSIS DIPSTICK
Bilirubin, UA: NEGATIVE
Blood, UA: NEGATIVE
KETONES UA: NEGATIVE
Leukocytes, UA: NEGATIVE
Nitrite, UA: NEGATIVE
PH UA: 6
Protein, UA: NEGATIVE
Spec Grav, UA: <=1.005
Urobilinogen, UA: NEGATIVE

## 2013-06-29 LAB — HEMOCCULT GUIAC POC 1CARD (OFFICE)

## 2013-06-29 MED ORDER — SULFAMETHOXAZOLE-TRIMETHOPRIM 400-80 MG PO TABS: 1.0000 | ORAL_TABLET | Freq: Two times a day (BID) | ORAL | Status: DC

## 2013-06-29 NOTE — Progress Notes (Signed)
   Subjective:    Patient ID: Joseph Johnston, male    DOB: 28-Nov-1955, 58 y.o.   MRN: [id number]  HPI He has had increased difficulty over the last 2 weeks with pain and urinary hesitancy frequency but no discharge. No recent extracurricular sexual activity. He is on Hytrin 2 mg for treatment of urinary urgency. He has been on that for several years. He was referred to urology however was unable to make the appointment.   Review of Systems     Objective:   Physical Exam Rectal exam does show a boggy but nontender prostate. Urinalysis is negative       Assessment & Plan:  Difficulty urinating - Plan: Urinalysis Dipstick, GC/chlamydia probe amp, urine  Prostatitis - Plan: sulfamethoxazole-trimethoprim (BACTRIM,SEPTRA) 400-80 MG per tablet, POCT occult blood stool  I will have him stop his Hytrin. He is to call me in 2 weeks to let me know how he is doing. May consider placing him on Flomax or possibly referring him to urology.

## 2013-06-29 NOTE — Patient Instructions (Signed)
Take the antibiotic for 2 weeks and stop the Hytrin. He may call in 2 weeks and let me know how you're doing

## 2013-06-30 LAB — GC/CHLAMYDIA PROBE AMP, URINE
Chlamydia, Swab/Urine, PCR: NEGATIVE
GC Probe Amp, Urine: NEGATIVE

## 2013-07-14 ENCOUNTER — Telehealth: Payer: Self-pay | Admitting: Family Medicine

## 2013-07-14 MED ORDER — OXYBUTYNIN CHLORIDE ER 5 MG PO TB24
5.0000 mg | ORAL_TABLET | Freq: Every day | ORAL | Status: DC
Start: 2013-07-14 — End: 2013-09-15

## 2013-07-14 NOTE — Telephone Encounter (Signed)
He continues to have difficulty mainly with urinary urgency. I will place him on Ditropan. He is to call me next week and let me know how it is working.

## 2013-07-14 NOTE — Telephone Encounter (Signed)
Pt called and advised you wanted to know if he was any better.  Pt states it has not corrected itself.  Please advise pt [phone]

## 2013-07-25 ENCOUNTER — Encounter: Payer: Self-pay | Admitting: *Deleted

## 2013-07-25 ENCOUNTER — Encounter: Payer: Federal, State, Local not specified - PPO | Attending: Family Medicine | Admitting: *Deleted

## 2013-07-25 VITALS — Ht 71.0 in | Wt 230.0 lb

## 2013-07-25 DIAGNOSIS — Z713 Dietary counseling and surveillance: Secondary | ICD-10-CM | POA: Insufficient documentation

## 2013-07-25 DIAGNOSIS — E669 Obesity, unspecified: Secondary | ICD-10-CM | POA: Insufficient documentation

## 2013-07-25 NOTE — Progress Notes (Signed)
Medical Nutrition Therapy:  Appt start time: 0800 end time:  0900.  Assessment:  Patient here today for weight management. Patient reports that since starting a new medication, his weight increased from usual weight of 225 pounds to 242 pounds in February. His weight is currently down 12 pounds since then. 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 However, he was less active this winter. He also reports that he has a sweet tooth, and overeats frequently at meals and with sweets. He usually only eats 1 very large meal daily with frequent snacking in the late afternoon and evening. He does the cooking, but his spouse does the grocery shopping since he prefers meat and potatoes. Patient reports that he likes eating healthy foods (vegetables from the garden), but his spouse will often bring him desserts.   MEDICATIONS: See list   DIETARY INTAKE:   Usual eating pattern includes 1 meals and 3-4 snacks per day.  24-hr recall:  B ( AM): None  Snk ( AM): None  L ( PM): Occasionally 1 sleeve of saltine crackers Snk ( PM): chips, 1 sleeve crackers with 2-3 slices of cheese D ( PM): Large portion of meat (6-9 ounces), potatoes (1 cup), 2 vegetables (corn, beans, greens) Snk ( PM): Dessert Beverages: Water  Usual physical activity: Landscaping job, walking daily  Estimated energy needs: 1800 calories 225 g carbohydrates 113 g protein 50 g fat  Progress Towards Goal(s):  In progress.   Nutritional Diagnosis:  [place]-3.3 Overweight/obesity As related to excessive portion size.  As evidenced by BMI >30.    Intervention:  Nutrition counseling. We discussed strategies for weight loss, including balancing nutrients (carbs, protein, fat), portion control, healthy snacks, and exercise.   Goals:  1. 1-2 pounds weight loss per week.  2. Reduce portion size of meat and starch, increasing portions of vegetables.  3. Incorporate a small lunch/large snack in the afternoon -  several pieces of fruit, vegetables, and 1/2 sleeve of crackers (reduced from 1 full sleeve).  4. Monitor portion size of sweets and desserts.  5. Participate in grocery shopping so you can get healthier foods.  6. Use post-it notes on desserts and sweets to remind patient about moderate portions.   Handouts given during visit include:  Weight loss tips  Meal plan card  Monitoring/Evaluation:  Dietary intake, exercise, and body weight in 1 month(s).

## 2013-07-27 ENCOUNTER — Other Ambulatory Visit: Payer: Federal, State, Local not specified - PPO

## 2013-07-27 DIAGNOSIS — Z113 Encounter for screening for infections with a predominantly sexual mode of transmission: Secondary | ICD-10-CM

## 2013-07-27 DIAGNOSIS — B20 Human immunodeficiency virus [HIV] disease: Secondary | ICD-10-CM

## 2013-07-27 LAB — RPR

## 2013-07-29 LAB — HIV-1 RNA QUANT-NO REFLEX-BLD
HIV 1 RNA Quant: <20 copies/mL (ref <20)
HIV-1 RNA Quant, Log: <1.3 {Log} (ref <1.30)

## 2013-08-02 ENCOUNTER — Other Ambulatory Visit: Payer: Federal, State, Local not specified - PPO

## 2013-08-03 LAB — T-HELPER CELL (CD4) - (RCID CLINIC ONLY)
CD4 T CELL HELPER: 38 % (ref 33–55)
CD4 T Cell Abs: 1530 /uL (ref 400–2700)

## 2013-08-25 ENCOUNTER — Ambulatory Visit (INDEPENDENT_AMBULATORY_CARE_PROVIDER_SITE_OTHER): Payer: Federal, State, Local not specified - PPO | Admitting: Infectious Disease

## 2013-08-25 ENCOUNTER — Encounter: Payer: Self-pay | Admitting: Infectious Disease

## 2013-08-25 VITALS — BP 154/88 | HR 72 | Temp 98.3°F | Ht 71.0 in | Wt 234.0 lb

## 2013-08-25 DIAGNOSIS — B20 Human immunodeficiency virus [HIV] disease: Secondary | ICD-10-CM | POA: Diagnosis not present

## 2013-08-25 NOTE — Progress Notes (Signed)
Subjective:    Patient ID: Joseph Johnston, male    DOB: 10/17/1955, 58 y.o.   MRN: [id number]  HPI   58 year old man recently diagnosed with HIV (wild type virus) started on tivicay and truvada with undetectable VL and healthy cd4 count. He had considered switch to stribild for insurance convenience and copay issues but ended up statying on Tivicay and Truvada. We had tried to change him to [facility] back in the fall but  His insurance may not have covered it. After much discussion today he would prefer to be on Tivicay and Truvada given debate about ABC exposure and CV risk.  He is also uncomfortable coming to our clinic and being recognized as he does not want other folks to deduce that he has HIV.    Review of Systems  Constitutional: Negative for fever, chills, diaphoresis, activity change, appetite change, fatigue and unexpected weight change.  HENT: Negative for congestion, rhinorrhea, sinus pressure, sneezing, sore throat and trouble swallowing.   Eyes: Negative for photophobia and visual disturbance.  Respiratory: Negative for cough, chest tightness, shortness of breath, wheezing and stridor.   Cardiovascular: Negative for chest pain, palpitations and leg swelling.  Gastrointestinal: Negative for nausea, vomiting, abdominal pain, diarrhea, constipation, blood in stool, abdominal distention and anal bleeding.  Genitourinary: Negative for dysuria, hematuria, flank pain and difficulty urinating.  Musculoskeletal: Negative for arthralgias, back pain, gait problem, joint swelling and myalgias.  Skin: Negative for color change, pallor, rash and wound.  Neurological: Positive for headaches. Negative for dizziness, tremors, weakness and light-headedness.  Hematological: Negative for adenopathy. Does not bruise/bleed easily.  Psychiatric/Behavioral: Negative for behavioral problems, confusion, sleep disturbance, dysphoric mood, decreased concentration and agitation.       Objective:   Physical Exam  Constitutional: He is oriented to person, place, and time. He appears well-developed and well-nourished. No distress.  HENT:  Head: Normocephalic and atraumatic.  Mouth/Throat: Oropharynx is clear and moist. No oropharyngeal exudate.  Eyes: Conjunctivae and EOM are normal. Pupils are equal, round, and reactive to light. No scleral icterus.  Neck: Normal range of motion. Neck supple. No JVD present.  Cardiovascular: Normal rate, regular rhythm and normal heart sounds.  Exam reveals no gallop and no friction rub.   No murmur heard. Pulmonary/Chest: Effort normal and breath sounds normal. No respiratory distress. He has no wheezes. He has no rales. He exhibits no tenderness.  Abdominal: He exhibits no distension and no mass. There is no tenderness. There is no rebound and no guarding.  Musculoskeletal: He exhibits no edema and no tenderness.  Lymphadenopathy:    He has no cervical adenopathy.  Neurological: He is alert and oriented to person, place, and time. He has normal reflexes. He exhibits normal muscle tone. Coordination normal.  Skin: Skin is warm and dry. He is not diaphoretic. No erythema. No pallor.  Psychiatric: He has a normal mood and affect. His behavior is normal. Judgment and thought content normal.          Assessment & Plan:   HIV: Continue Tivicay and Truvada. Patient would like to be followed by Dr. Redmond School his primary care physician alone and given that pt is highly compliant with his medications and that Dr. Redmond School has other pts that he manages with HIV very well I am comfortable with this though generally I like to have pts with HIV check in with [place] on a yearly basis regardless. I also did introduce him to research staff here because that is another  area that he may miss out on if he is not a patient here but potentially could still work with our research staff will continue to see his PCP. He is also considering a move to [place].  I spent greater than  25  minutes with the patient including greater than 50% of time in face to face counsel of the patient and in coordination of their care.

## 2013-09-05 ENCOUNTER — Encounter: Payer: Federal, State, Local not specified - PPO | Attending: Family Medicine | Admitting: *Deleted

## 2013-09-05 ENCOUNTER — Encounter: Payer: Self-pay | Admitting: *Deleted

## 2013-09-05 VITALS — Ht 71.0 in | Wt 224.4 lb

## 2013-09-05 DIAGNOSIS — E669 Obesity, unspecified: Secondary | ICD-10-CM

## 2013-09-05 DIAGNOSIS — Z713 Dietary counseling and surveillance: Secondary | ICD-10-CM | POA: Diagnosis not present

## 2013-09-05 NOTE — Progress Notes (Signed)
  Medical Nutrition Therapy:  Appt start time: 0800 end time:  0830.   Assessment:  Patient returns today for a follow up for weight management. His weight is down to 224.4 from 230 pounds in 6 weeks. He has reduced portion size at evening meal, taking smaller portions and using a smaller plate. He has also been more mindful about snacking, cutting out snacks after dinner and bringing healthier snacks to work. He is also trying to be more active at work, and forcing himself to walk to the car to get snacks. He does state that this week will be difficult to practice mindful eating as his spouse had back surgery this weekend, and family members have been bringing food/desserts into house. We discussed the possibility of maintaining weight throughout this.   MEDICATIONS: See list   DIETARY INTAKE:   Usual eating pattern includes 3 meals and 1-2 snacks per day.  24-hr recall:  B ( AM): cereal with fruit  Snk ( AM): None  L ( PM): Sleeve of saltine crackers, vegetables (cabbage, tomatoes) Snk ( PM): Crackers, vegetables, fruit D ( PM): Reduced portion of meat, potatoes, 2 vegetables Snk ( PM): None Beverages: Water  Usual physical activity: Landscaping job, walking daily around pool  Estimated energy needs: 1800 calories 225 g carbohydrates 113 g protein 50 g fat  Progress Towards Goal(s):  Some progress.   Nutritional Diagnosis:  [place]-3.3 Overweight/obesity As related to excessive portion size. As evidenced by BMI >30.    Intervention:  Nutrition counseling. Patient praised on efforts to change dietary habits. We discussed ways to deal with food temptations, including practicing mindful eating, drinking water, or being active when wanting to eat. Patient also advised to recognize actual hunger vs mindless snacking and have a plan for healthy snacks as needed.   Goals:  1. 1-2 pounds weight loss per week.  2. Continue to reduce portion size of meats and starches, using smaller plate,  limiting to 1 portion at meals.  3. Plan for healthy snacks (fruit, vegetables, limited amounts of crackers).  4. Continue increasing physical activity.   Monitoring/Evaluation:  Dietary intake, exercise, and body weight prn.

## 2013-09-15 ENCOUNTER — Encounter (HOSPITAL_COMMUNITY): Payer: Self-pay | Admitting: Emergency Medicine

## 2013-09-15 ENCOUNTER — Encounter: Payer: Self-pay | Admitting: Family Medicine

## 2013-09-15 ENCOUNTER — Ambulatory Visit (INDEPENDENT_AMBULATORY_CARE_PROVIDER_SITE_OTHER): Payer: Federal, State, Local not specified - PPO | Admitting: Family Medicine

## 2013-09-15 ENCOUNTER — Inpatient Hospital Stay (HOSPITAL_COMMUNITY)
Admission: EM | Admit: 2013-09-15 | Discharge: 2013-09-16 | DRG: 287 | Disposition: A | Discharge status: Home / Self Care | Payer: Federal, State, Local not specified - PPO | Attending: Internal Medicine | Admitting: Internal Medicine

## 2013-09-15 ENCOUNTER — Emergency Department (HOSPITAL_COMMUNITY): Payer: Federal, State, Local not specified - PPO

## 2013-09-15 VITALS — BP 120/80 | HR 64 | Wt 224.0 lb

## 2013-09-15 DIAGNOSIS — Z800 Family history of malignant neoplasm of digestive organs: Secondary | ICD-10-CM

## 2013-09-15 DIAGNOSIS — G4730 Sleep apnea, unspecified: Secondary | ICD-10-CM | POA: Diagnosis present

## 2013-09-15 DIAGNOSIS — I200 Unstable angina: Secondary | ICD-10-CM

## 2013-09-15 DIAGNOSIS — Z7982 Long term (current) use of aspirin: Secondary | ICD-10-CM | POA: Diagnosis not present

## 2013-09-15 DIAGNOSIS — Z881 Allergy status to other antibiotic agents status: Secondary | ICD-10-CM | POA: Diagnosis not present

## 2013-09-15 DIAGNOSIS — I10 Essential (primary) hypertension: Secondary | ICD-10-CM | POA: Diagnosis present

## 2013-09-15 DIAGNOSIS — G2581 Restless legs syndrome: Secondary | ICD-10-CM | POA: Diagnosis present

## 2013-09-15 DIAGNOSIS — Z8371 Family history of colonic polyps: Secondary | ICD-10-CM | POA: Diagnosis not present

## 2013-09-15 DIAGNOSIS — E7800 Pure hypercholesterolemia, unspecified: Secondary | ICD-10-CM | POA: Diagnosis present

## 2013-09-15 DIAGNOSIS — K5732 Diverticulitis of large intestine without perforation or abscess without bleeding: Secondary | ICD-10-CM | POA: Diagnosis present

## 2013-09-15 DIAGNOSIS — Z87891 Personal history of nicotine dependence: Secondary | ICD-10-CM | POA: Diagnosis not present

## 2013-09-15 DIAGNOSIS — Z880 Allergy status to penicillin: Secondary | ICD-10-CM

## 2013-09-15 DIAGNOSIS — E785 Hyperlipidemia, unspecified: Secondary | ICD-10-CM | POA: Diagnosis present

## 2013-09-15 DIAGNOSIS — Z8249 Family history of ischemic heart disease and other diseases of the circulatory system: Secondary | ICD-10-CM | POA: Diagnosis not present

## 2013-09-15 DIAGNOSIS — Z833 Family history of diabetes mellitus: Secondary | ICD-10-CM

## 2013-09-15 DIAGNOSIS — R079 Chest pain, unspecified: Secondary | ICD-10-CM | POA: Diagnosis present

## 2013-09-15 DIAGNOSIS — M109 Gout, unspecified: Secondary | ICD-10-CM | POA: Diagnosis present

## 2013-09-15 DIAGNOSIS — Z83719 Family history of colon polyps, unspecified: Secondary | ICD-10-CM

## 2013-09-15 DIAGNOSIS — I209 Angina pectoris, unspecified: Secondary | ICD-10-CM

## 2013-09-15 DIAGNOSIS — L219 Seborrheic dermatitis, unspecified: Secondary | ICD-10-CM | POA: Diagnosis present

## 2013-09-15 DIAGNOSIS — B20 Human immunodeficiency virus [HIV] disease: Secondary | ICD-10-CM | POA: Diagnosis present

## 2013-09-15 DIAGNOSIS — Z21 Asymptomatic human immunodeficiency virus [HIV] infection status: Secondary | ICD-10-CM | POA: Diagnosis present

## 2013-09-15 DIAGNOSIS — N400 Enlarged prostate without lower urinary tract symptoms: Secondary | ICD-10-CM

## 2013-09-15 LAB — CBC
HEMATOCRIT: 39.6 % (ref 39.0–52.0)
Hemoglobin: 13.9 g/dL (ref 13.0–17.0)
MCH: 32.9 pg (ref 26.0–34.0)
MCHC: 35.1 g/dL (ref 30.0–36.0)
MCV: 93.6 fL (ref 78.0–100.0)
Platelets: 276 10*3/uL (ref 150–400)
RBC: 4.23 MIL/uL (ref 4.22–5.81)
RDW: 12.8 % (ref 11.5–15.5)
WBC: 6.7 10*3/uL (ref 4.0–10.5)

## 2013-09-15 LAB — BASIC METABOLIC PANEL
Anion gap: 11 (ref 5–15)
BUN: 9 mg/dL (ref 6–23)
CALCIUM: 9.7 mg/dL (ref 8.4–10.5)
CO2: 28 meq/L (ref 19–32)
CREATININE: 1.05 mg/dL (ref 0.50–1.35)
Chloride: 103 mEq/L (ref 96–112)
GFR calc Af Amer: 90 mL/min — ABNORMAL LOW (ref >90)
GFR, EST NON AFRICAN AMERICAN: 78 mL/min — AB (ref >90)
GLUCOSE: 117 mg/dL — AB (ref 70–99)
Potassium: 4.1 mEq/L (ref 3.7–5.3)
SODIUM: 142 meq/L (ref 137–147)

## 2013-09-15 LAB — I-STAT TROPONIN, ED: TROPONIN I, POC: 0 ng/mL (ref 0.00–0.08)

## 2013-09-15 MED ORDER — MORPHINE SULFATE 2 MG/ML IJ SOLN
2.0000 mg | INTRAMUSCULAR | Status: DC | PRN
  Administered 2013-09-15: 2 mg via INTRAVENOUS
  Filled 2013-09-15: qty 1

## 2013-09-15 NOTE — ED Notes (Signed)
Pt given 2 mg morphine for chest discomfort; on reassessment, pt stated he felt dizzy; HR 50's; BP checked and 72/35; 2nd IV started and MD paged

## 2013-09-15 NOTE — ED Provider Notes (Signed)
CSN: [id number]     Arrival date & time 09/15/13  1603 History   First MD Initiated Contact with Patient 09/15/13 2016     Chief Complaint  Patient presents with  . Chest Pain    (Consider location/radiation/quality/duration/timing/severity/associated sxs/prior Treatment) HPI Comments: Patient is a 29 her old with a history of hypertension, hyperlipidemia, HIV, and gout who presents the emergency department for chest pain. Patient states that 6 days ago he was walking up a hill the ladder when he began to feel a squeezing/the pain in his left chest. Patient denies any radiation of the pain and states that it was associated with weakness, dizziness, nausea, and shortness of breath. Patient also endorses feeling "clammy". Patient states the pain improved with rest, but he has had a constant residual pain rated 1-2/10 since this time which worsens with exertion. Patient denies syncope, neck pain or jaw pain, extremity numbness/weakness, vomiting, abdominal pain, and fever. Patient endorses a history of ACS and his sister at the age of 39. Patient denies any personal history of ACS, stroke, DVT, or PE. Last CD4 count: 1530. Last stress test was 2-3 years ago and was normal.  Patient is a 58 y.o. male presenting with chest pain. The history is provided by the patient. No language interpreter was used.  Chest Pain Associated symptoms: diaphoresis, nausea and shortness of breath   Associated symptoms: no numbness, not vomiting and no weakness     Past Medical History  Diagnosis Date  . Gout   . 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 Seborrheic dermatitis   . Ulcerative colitis   . Hyperlipidemia   . HIV (human immunodeficiency virus infection)   . Dyslipidemia   . Hx of colonic polyps   . Allergic rhinitis   . Hypercholesterolemia   . Restless leg   . Diverticulitis   . Hypertension    Past Surgical History  Procedure Laterality Date  . Left shoulder surgery    . Colonoscopy  11/26/09   Family  History  Problem Relation Age of Onset  . Coronary artery disease Sister 35    MI  . Colon cancer Brother   . Hyperlipidemia Mother   . Hypertension Mother   . Diabetes Mother   . Colon polyps Mother   . Hyperlipidemia Father   . Hypertension Father   . Diabetes Father   . Hyperlipidemia Sister   . Hyperlipidemia Brother   . Hypertension Brother   . Diabetes Sister   . Diabetes Brother   . Colon polyps Sister    History  Substance Use Topics  . Smoking status: Former Smoker -- 0.00 packs/day for 30 years    Quit date: 01/28/2000  . Smokeless tobacco: Never Used  . Alcohol Use: No     Comment: Former alcholic; none since 0626    Review of Systems  Constitutional: Positive for diaphoresis.  Respiratory: Positive for shortness of breath.   Cardiovascular: Positive for chest pain.  Gastrointestinal: Positive for nausea. Negative for vomiting.  Musculoskeletal: Negative for neck pain.  Neurological: Negative for weakness and numbness.  All other systems reviewed and are negative.    Allergies  Doxycycline and Penicillins  Home Medications   Prior to Admission medications   Medication Sig Start Date End Date Taking? Authorizing Provider  allopurinol (ZYLOPRIM) 100 MG tablet Take 100 mg by mouth daily.   Yes Historical Provider, MD  aspirin 81 MG tablet Take 81 mg by mouth daily.   Yes Historical Provider, MD  B Complex-C (SUPER B COMPLEX PO) Take 1 tablet by mouth daily.   Yes Historical Provider, MD  balsalazide (COLAZAL) 750 MG capsule Take 1,500 mg by mouth 2 (two) times daily. 11/22/12  Yes Denita Lung, MD  [facility] Liver Oil CAPS Take 1 capsule by mouth.   Yes Historical Provider, MD  dolutegravir (TIVICAY) 50 MG tablet Take 50 mg by mouth daily.   Yes Historical Provider, MD  emtricitabine-tenofovir (TRUVADA) 200-300 MG per tablet Take 1 tablet by mouth daily.   Yes Historical Provider, MD  KRILL OIL PO Take 1 tablet by mouth daily.   Yes Historical Provider, MD   Multiple Vitamin (MULTIVITAMIN) capsule Take 1 capsule by mouth daily.   Yes Historical Provider, MD  oxybutynin (DITROPAN-XL) 5 MG 24 hr tablet Take 5 mg by mouth at bedtime.   Yes Historical Provider, MD  rOPINIRole (REQUIP) 0.25 MG tablet Take 0.25 mg by mouth daily.   Yes Historical Provider, MD   BP 125/93  Pulse 74  Temp(Src) 98.2 F (36.8 C) (Oral)  Resp 23  Ht 5\' 11"  (1.803 m)  Wt 224 lb (101.606 kg)  BMI 31.26 kg/m2  SpO2 94%  Physical Exam  Nursing note and vitals reviewed. Constitutional: He is oriented to person, place, and time. He appears well-developed and well-nourished. No distress.  Nontoxic/nonseptic appearing  HENT:  Head: Normocephalic and atraumatic.  Eyes: Conjunctivae and EOM are normal. No scleral icterus.  Neck: Normal range of motion. Neck supple.  Cardiovascular: Normal rate, regular rhythm, normal heart sounds and intact distal pulses.   No carotid bruits bilaterally. No JVD.  Pulmonary/Chest: Effort normal and breath sounds normal. No respiratory distress. He has no wheezes. He has no rales.  Chest expansion symmetric. No tachypnea or dyspnea.  Musculoskeletal: Normal range of motion.  Neurological: He is alert and oriented to person, place, and time. He exhibits normal muscle tone. Coordination normal.  GCS 15. Patient moves extremities without ataxia.  Skin: Skin is warm and dry. No rash noted. He is not diaphoretic. No erythema. No pallor.  Psychiatric: He has a normal mood and affect. His behavior is normal.    ED Course  Procedures (including critical care time) Labs Review Labs Reviewed  BASIC METABOLIC PANEL - Abnormal; Notable for the following:    Glucose, Bld 117 (*)    GFR calc non Af Amer 78 (*)    GFR calc Af Amer 90 (*)    All other components within normal limits  CBC  I-STAT TROPOININ, ED    Imaging Review Dg Chest 2 View  09/15/2013   CLINICAL DATA:  Chest pain .  EXAM: CHEST  2 VIEW  COMPARISON:  08/25/2005.  FINDINGS:  Mediastinum and hilar structures normal. Lungs are clear. Heart size normal. No acute bony abnormality identified.  IMPRESSION: No active cardiopulmonary disease.   Electronically Signed   By: Marcello Moores  Register   On: 09/15/2013 16:49     EKG Interpretation   Date/Time:  Thursday September 15 2013 16:11:08 EDT Ventricular Rate:  96 PR Interval:  126 QRS Duration: 102 QT Interval:  366 QTC Calculation: 462 R Axis:   8 Text Interpretation:  Sinus rhythm with Premature supraventricular  complexes Incomplete right bundle branch block Nonspecific ST abnormality  Abnormal ECG No old tracing to compare Confirmed by CAMPOS  MD, Lennette Bihari  ([id number]) on 09/15/2013 8:46:02 PM      MDM   Final diagnoses:  Chest pain, unspecified chest pain type    58 year old  male presents to the emergency department for chest pain characterized as squeezing/tightness with associated shortness of breath, nausea, dizziness, and diaphoresis. Symptoms began 6 days ago. Patient endorses a constant residual pain in his left chest since this time. Cardiac workup is negative. Heart Score 3-4, depending on degree of suspicion. Have discussed case with Dr. Radford Pax who recommends admission to hospitalist service with likely stress tomorrow. [facility] to admit.   Filed Vitals:   09/15/13 2100 09/15/13 2115 09/15/13 2130 09/15/13 2145  BP: 128/85 129/88 124/77 115/79  Pulse: 67 59 74 68  Temp:      TempSrc:      Resp: 19 19 21 17   Height:      Weight:      SpO2: 96% 95% 96% 96%       Antonietta Breach, PA-C 09/15/13 2154

## 2013-09-15 NOTE — ED Notes (Signed)
Pt HR brady to 37 and BP 51/34; Dr Humphrey Rolls made aware and coming to bedside

## 2013-09-15 NOTE — H&P (Signed)
[facility] History and Physical  Joseph Johnston:[id number] DOB: [birth year] DOA: 09/15/2013  Referring physician: Antonietta Breach, PA PCP: Wyatt Haste, MD   Chief Complaint: Chest Pain  HPI: Joseph Johnston is a 58 y.o. male presents with chest pain. Patient has no prior history of cardiac issues. He states that he had the pain start about 6 days ago. He states it appeared to come on with exertion. Patient states there was some associated nausea and generalized weakness which he does not have now. Patient also felt some shortness of breath with the pain. 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 Patient has had a prior stress test which was negative about 3 years ago. Patient states that he has not smoked for thirteen years and uses no drugs and does not drink. He does have HIV and his last viral load was not detectable with a normal CD4 count.   Review of Systems:  Constitutional:  No weight loss, night sweats, Fevers, chills, fatigue.  HEENT:  No headaches, Difficulty swallowing,Tooth/dental problems,Sore throat,  No sneezing Cardio-vascular:  ++chest pain, Orthopnea, PND, swelling in lower extremities, anasarca, dizziness, palpitations  GI:  No heartburn, indigestion, abdominal pain, nausea, vomiting, diarrhea, change in bowel habits, loss of appetite  Resp:  No shortness of breath with exertion or at rest. No excess mucus, no productive cough, No non-productive cough, No coughing up of blood.No change in color of mucus.No wheezing.No chest wall deformity  Skin:  no rash or lesions.  GU:  no dysuria, change in color of urine, no urgency or frequency. No flank pain.  Musculoskeletal:  No joint pain or swelling. No decreased range of motion. No back pain.  Psych:  No change in mood or affect. No depression or anxiety. No memory loss.   Past Medical History  Diagnosis Date  . Gout   .  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 Seborrheic dermatitis   . Ulcerative colitis   . Hyperlipidemia   . HIV (human immunodeficiency virus infection)   . Dyslipidemia   . Hx of colonic polyps   . Allergic rhinitis   . Hypercholesterolemia   . Restless leg   . Diverticulitis   . Hypertension    Past Surgical History  Procedure Laterality Date  . Left shoulder surgery    . Colonoscopy  11/26/09   Social History:  reports that he quit smoking about 13 years ago. He has never used smokeless tobacco. He reports that he does not drink alcohol or use illicit drugs.  Allergies  Allergen Reactions  . Doxycycline Nausea Only  . Penicillins     REACTION: rash/hives    Family History  Problem Relation Age of Onset  . Coronary artery disease Sister 39    MI  . Colon cancer Brother   . Hyperlipidemia Mother   . Hypertension Mother   . Diabetes Mother   . Colon polyps Mother   . Hyperlipidemia Father   . Hypertension Father   . Diabetes Father   . Hyperlipidemia Sister   . Hyperlipidemia Brother   . Hypertension Brother   . Diabetes Sister   . Diabetes Brother   . Colon polyps Sister      Prior to Admission medications   Medication Sig Start Date End Date Taking? Authorizing Provider  allopurinol (ZYLOPRIM) 100 MG tablet Take 100 mg by mouth daily.   Yes Historical Provider, MD  aspirin 81 MG tablet Take 81 mg by mouth daily.   Yes Historical Provider, MD  B Complex-C (SUPER B COMPLEX PO) Take 1 tablet by mouth daily.   Yes Historical Provider, MD  balsalazide (COLAZAL) 750 MG capsule Take 1,500 mg by mouth 2 (two) times daily. 11/22/12  Yes Denita Lung, MD  [facility] Liver Oil CAPS Take 1 capsule by mouth.   Yes Historical Provider, MD  dolutegravir (TIVICAY) 50 MG tablet Take 50 mg by mouth daily.   Yes Historical Provider, MD  emtricitabine-tenofovir (TRUVADA) 200-300 MG per tablet Take 1 tablet by mouth daily.   Yes Historical Provider, MD  KRILL OIL PO Take 1 tablet by mouth daily.    Yes Historical Provider, MD  Multiple Vitamin (MULTIVITAMIN) capsule Take 1 capsule by mouth daily.   Yes Historical Provider, MD  oxybutynin (DITROPAN-XL) 5 MG 24 hr tablet Take 5 mg by mouth at bedtime.   Yes Historical Provider, MD  rOPINIRole (REQUIP) 0.25 MG tablet Take 0.25 mg by mouth daily.   Yes Historical Provider, MD   Physical Exam: Filed Vitals:   09/15/13 2100 09/15/13 2115 09/15/13 2130 09/15/13 2145  BP: 128/85 129/88 124/77 115/79  Pulse: 67 59 74 68  Temp:      TempSrc:      Resp: 19 19 21 17   Height:      Weight:      SpO2: 96% 95% 96% 96%    Wt Readings from Last 3 Encounters:  09/15/13 101.606 kg (224 lb)  09/15/13 101.606 kg (224 lb)  09/05/13 101.787 kg (224 lb 6.4 oz)    General:  Appears calm and comfortable Eyes: PERRL, normal lids, irises & conjunctiva ENT: grossly normal hearing, lips & tongue Neck: no LAD, masses or thyromegaly Cardiovascular: RRR, no m/r/g. No LE edema. Telemetry: SR, no arrhythmias  Respiratory: CTA bilaterally, no w/r/r. Normal respiratory effort. Abdomen: soft, ntnd Skin: no rash or induration seen on limited exam Musculoskeletal: grossly normal tone BUE/BLE Psychiatric: grossly normal mood and affect, speech fluent and appropriate Neurologic: grossly non-focal.          Labs on Admission:  Basic Metabolic Panel:  Recent Labs Lab 09/15/13 1620  NA 142  K 4.1  CL 103  CO2 28  GLUCOSE 117*  BUN 9  CREATININE 1.05  CALCIUM 9.7   Liver Function Tests: No results found for this basename: AST, ALT, ALKPHOS, BILITOT, PROT, ALBUMIN,  in the last 168 hours No results found for this basename: LIPASE, AMYLASE,  in the last 168 hours No results found for this basename: AMMONIA,  in the last 168 hours CBC:  Recent Labs Lab 09/15/13 1620  WBC 6.7  HGB 13.9  HCT 39.6  MCV 93.6  PLT 276   Cardiac Enzymes: No results found for this basename: CKTOTAL, CKMB, CKMBINDEX, TROPONINI,  in the last 168 hours  BNP (last  3 results) No results found for this basename: PROBNP,  in the last 8760 hours CBG: No results found for this basename: GLUCAP,  in the last 168 hours  Radiological Exams on Admission: Dg Chest 2 View  09/15/2013   CLINICAL DATA:  Chest pain .  EXAM: CHEST  2 VIEW  COMPARISON:  08/25/2005.  FINDINGS: Mediastinum and hilar structures normal. Lungs are clear. Heart size normal. No acute bony abnormality identified.  IMPRESSION: No active cardiopulmonary disease.   Electronically Signed   By: Marcello Moores  Register   On: 09/15/2013 16:49    EKG: Independently reviewed.  NSR  Assessment/Plan Principal Problem:   Chest pain Active Problems:   Sleep apnea   Human immunodeficiency virus (HIV) disease   HTN (hypertension)   1. Chest pain -will admit for observation -patient will be placed on telemetry -will check serial enzymes -cardiology evaluation in the morning  2. Sleep Apnea -he states that he does not have OSA -he does take requip for his RLS and this will be continued  3. HIV -currently under conrtol -continue with anti-retroviral therapy -He states his virus load is undetectable  4. Hypertension -will continue with home medications    Code Status: full code (must indicate code status--if unknown or must be presumed, indicate so) DVT Prophylaxis:heparin Family Communication: Partner in room (indicate person spoken with, if applicable, with phone number if by telephone) Disposition Plan: home (indicate anticipated LOS)  Time spent: 35min  Daelynn Blower A [facility] Pager [phone]  **Disclaimer: This note may have been dictated with voice recognition software. Similar sounding words can inadvertently be transcribed and this note may contain transcription errors which may not have been corrected upon publication of note.**

## 2013-09-15 NOTE — ED Notes (Signed)
Dr khan at bedside

## 2013-09-15 NOTE — ED Notes (Signed)
Pt reports since last Thursday chest tightness, SOB, dizzy, nauseated. Denies cardiac hx, states feels like chest is gripping. Skin is warm and dry.

## 2013-09-15 NOTE — ED Provider Notes (Signed)
Medical screening examination/treatment/procedure(s) were performed by non-physician practitioner and as supervising physician I was immediately available for consultation/collaboration.   EKG Interpretation   Date/Time:  Thursday September 15 2013 16:11:08 EDT Ventricular Rate:  96 PR Interval:  126 QRS Duration: 102 QT Interval:  366 QTC Calculation: 462 R Axis:   8 Text Interpretation:  Sinus rhythm with Premature supraventricular  complexes Incomplete right bundle branch block Nonspecific ST abnormality  Abnormal ECG No old tracing to compare Confirmed by Kree Rafter  MD, Jashua Knaak  ([id number]) on 09/15/2013 8:46:02 PM        Hoy Morn, MD 09/15/13 2226

## 2013-09-15 NOTE — Progress Notes (Signed)
   Subjective:    Patient ID: Joseph Johnston, male    DOB: [birth year], 58 y.o.   MRN: [id number]  HPI He states that last Thursday he had difficulty with malaise and then developed some chest tightness he describes as a squeezing sensation, shortness of breath and diaphoresis. He noted this especially with physical activity. He didn't not go to the hospital.. He states that by Tuesday of this week he was feeling better but still having the above symptoms with physical activity. He does have a positive family history for heart disease with a sister at age 53 and father at age 25 having a heart attack. He did have an exercise stress test done in October 2013.   Review of Systems     Objective:   Physical Exam Alert and in no distress. Cardiac exam shows regular rhythm without murmurs or gallops. EKG shows no acute changes and was compared with a previous tracing in 2013.       Assessment & Plan:  New-onset angina  Family history of heart disease in male family member before age 53  case was discussed with Dr. Stanford Breed who recommended sending him to [facility] hospital for further evaluation and treatment.

## 2013-09-16 ENCOUNTER — Encounter (HOSPITAL_COMMUNITY): Payer: Self-pay | Admitting: *Deleted

## 2013-09-16 ENCOUNTER — Encounter (HOSPITAL_COMMUNITY)
Admission: EM | Disposition: A | Discharge status: Home / Self Care | Payer: Self-pay | Source: Home / Self Care | Attending: Internal Medicine

## 2013-09-16 DIAGNOSIS — I10 Essential (primary) hypertension: Secondary | ICD-10-CM

## 2013-09-16 DIAGNOSIS — I200 Unstable angina: Secondary | ICD-10-CM

## 2013-09-16 DIAGNOSIS — B20 Human immunodeficiency virus [HIV] disease: Secondary | ICD-10-CM

## 2013-09-16 DIAGNOSIS — R079 Chest pain, unspecified: Secondary | ICD-10-CM

## 2013-09-16 HISTORY — PX: LEFT HEART CATHETERIZATION WITH CORONARY ANGIOGRAM: SHX5451

## 2013-09-16 LAB — TROPONIN I
Troponin I: <0.3 ng/mL (ref <0.30)
Troponin I: <0.3 ng/mL (ref <0.30)

## 2013-09-16 LAB — CBC
HCT: 36.4 % — ABNORMAL LOW (ref 39.0–52.0)
Hemoglobin: 12.9 g/dL — ABNORMAL LOW (ref 13.0–17.0)
MCH: 33.4 pg (ref 26.0–34.0)
MCHC: 35.4 g/dL (ref 30.0–36.0)
MCV: 94.3 fL (ref 78.0–100.0)
PLATELETS: 250 10*3/uL (ref 150–400)
RBC: 3.86 MIL/uL — ABNORMAL LOW (ref 4.22–5.81)
RDW: 12.8 % (ref 11.5–15.5)
WBC: 6.8 10*3/uL (ref 4.0–10.5)

## 2013-09-16 LAB — CREATININE, SERUM
CREATININE: 1.09 mg/dL (ref 0.50–1.35)
GFR calc non Af Amer: 74 mL/min — ABNORMAL LOW (ref >90)
GFR, EST AFRICAN AMERICAN: 86 mL/min — AB (ref >90)

## 2013-09-16 LAB — MRSA PCR SCREENING: MRSA by PCR: NEGATIVE

## 2013-09-16 LAB — PROTIME-INR
INR: 1.11 (ref 0.00–1.49)
PROTHROMBIN TIME: 14.3 s (ref 11.6–15.2)

## 2013-09-16 SURGERY — LEFT HEART CATHETERIZATION WITH CORONARY ANGIOGRAM
Anesthesia: LOCAL

## 2013-09-16 MED ORDER — LIDOCAINE HCL (PF) 1 % IJ SOLN
INTRAMUSCULAR | Status: AC
  Filled 2013-09-16: qty 30

## 2013-09-16 MED ORDER — MIDAZOLAM HCL 2 MG/2ML IJ SOLN
INTRAMUSCULAR | Status: AC
  Filled 2013-09-16: qty 2

## 2013-09-16 MED ORDER — DOLUTEGRAVIR SODIUM 50 MG PO TABS
50.0000 mg | ORAL_TABLET | Freq: Every day | ORAL | Status: DC
  Filled 2013-09-16: qty 1

## 2013-09-16 MED ORDER — ROPINIROLE HCL 0.25 MG PO TABS
0.2500 mg | ORAL_TABLET | Freq: Every day | ORAL | Status: DC
  Administered 2013-09-16: 0.25 mg via ORAL
  Filled 2013-09-16 (×2): qty 1

## 2013-09-16 MED ORDER — ACETAMINOPHEN 325 MG PO TABS: 650.0000 mg | ORAL_TABLET | ORAL | Status: DC | PRN

## 2013-09-16 MED ORDER — SODIUM CHLORIDE 0.9 % IV SOLN: INTRAVENOUS | Status: DC

## 2013-09-16 MED ORDER — SODIUM CHLORIDE 0.9 % IV SOLN
INTRAVENOUS | Status: DC
  Administered 2013-09-16: via INTRAVENOUS

## 2013-09-16 MED ORDER — ASPIRIN 81 MG PO CHEW: 81.0000 mg | CHEWABLE_TABLET | ORAL | Status: DC

## 2013-09-16 MED ORDER — OMEGA-3-ACID ETHYL ESTERS 1 G PO CAPS
1.0000 g | ORAL_CAPSULE | Freq: Every morning | ORAL | Status: DC
  Administered 2013-09-16: 1 g via ORAL
  Filled 2013-09-16: qty 1

## 2013-09-16 MED ORDER — ALLOPURINOL 100 MG PO TABS
100.0000 mg | ORAL_TABLET | Freq: Every day | ORAL | Status: DC
  Administered 2013-09-16: 100 mg via ORAL
  Filled 2013-09-16: qty 1

## 2013-09-16 MED ORDER — SODIUM CHLORIDE 0.9 % IJ SOLN: 3.0000 mL | INTRAMUSCULAR | Status: DC | PRN

## 2013-09-16 MED ORDER — SODIUM CHLORIDE 0.9 % IV SOLN: 250.0000 mL | INTRAVENOUS | Status: DC | PRN

## 2013-09-16 MED ORDER — HEPARIN (PORCINE) IN NACL 2-0.9 UNIT/ML-% IJ SOLN
INTRAMUSCULAR | Status: AC
  Filled 2013-09-16: qty 1000

## 2013-09-16 MED ORDER — FENTANYL CITRATE 0.05 MG/ML IJ SOLN
INTRAMUSCULAR | Status: AC
  Filled 2013-09-16: qty 2

## 2013-09-16 MED ORDER — KRILL OIL 1000 MG PO CAPS: ORAL_CAPSULE | Freq: Every morning | ORAL | Status: DC

## 2013-09-16 MED ORDER — B COMPLEX-C PO TABS
1.0000 | ORAL_TABLET | Freq: Every morning | ORAL | Status: DC
  Administered 2013-09-16: 1 via ORAL
  Filled 2013-09-16: qty 1

## 2013-09-16 MED ORDER — HEPARIN SODIUM (PORCINE) 1000 UNIT/ML IJ SOLN
INTRAMUSCULAR | Status: AC
  Filled 2013-09-16: qty 1

## 2013-09-16 MED ORDER — CETYLPYRIDINIUM CHLORIDE 0.05 % MT LIQD
7.0000 mL | Freq: Two times a day (BID) | OROMUCOSAL | Status: DC
  Administered 2013-09-16: 7 mL via OROMUCOSAL

## 2013-09-16 MED ORDER — EMTRICITABINE-TENOFOVIR DF 200-300 MG PO TABS
1.0000 | ORAL_TABLET | Freq: Every day | ORAL | Status: DC
  Filled 2013-09-16: qty 1

## 2013-09-16 MED ORDER — BALSALAZIDE DISODIUM 750 MG PO CAPS
1500.0000 mg | ORAL_CAPSULE | Freq: Two times a day (BID) | ORAL | Status: DC
  Administered 2013-09-16: 1500 mg via ORAL
  Filled 2013-09-16 (×3): qty 2

## 2013-09-16 MED ORDER — HEPARIN SODIUM (PORCINE) 5000 UNIT/ML IJ SOLN
5000.0000 [IU] | Freq: Three times a day (TID) | INTRAMUSCULAR | Status: DC
  Administered 2013-09-16: 5000 [IU] via SUBCUTANEOUS
  Filled 2013-09-16 (×4): qty 1

## 2013-09-16 MED ORDER — ONDANSETRON HCL 4 MG/2ML IJ SOLN: 4.0000 mg | Freq: Four times a day (QID) | INTRAMUSCULAR | Status: DC | PRN

## 2013-09-16 MED ORDER — ADULT MULTIVITAMIN W/MINERALS CH
1.0000 | ORAL_TABLET | Freq: Every day | ORAL | Status: DC
  Administered 2013-09-16: 1 via ORAL
  Filled 2013-09-16: qty 1

## 2013-09-16 MED ORDER — SODIUM CHLORIDE 0.9 % IV SOLN: 1.0000 mL/kg/h | INTRAVENOUS | Status: AC

## 2013-09-16 MED ORDER — ASPIRIN EC 81 MG PO TBEC
81.0000 mg | DELAYED_RELEASE_TABLET | Freq: Every day | ORAL | Status: DC
  Administered 2013-09-16: 81 mg via ORAL
  Filled 2013-09-16: qty 1

## 2013-09-16 MED ORDER — VERAPAMIL HCL 2.5 MG/ML IV SOLN
INTRAVENOUS | Status: AC
  Filled 2013-09-16: qty 2

## 2013-09-16 MED ORDER — NITROGLYCERIN 1 MG/10 ML FOR IR/CATH LAB
INTRA_ARTERIAL | Status: AC
  Filled 2013-09-16: qty 10

## 2013-09-16 MED ORDER — ZOLPIDEM TARTRATE 5 MG PO TABS: 5.0000 mg | ORAL_TABLET | Freq: Every evening | ORAL | Status: DC | PRN

## 2013-09-16 MED ORDER — OXYBUTYNIN CHLORIDE ER 5 MG PO TB24
5.0000 mg | ORAL_TABLET | Freq: Every day | ORAL | Status: DC
  Administered 2013-09-16: 5 mg via ORAL
  Filled 2013-09-16 (×2): qty 1

## 2013-09-16 MED ORDER — SODIUM CHLORIDE 0.9 % IJ SOLN: 3.0000 mL | Freq: Two times a day (BID) | INTRAMUSCULAR | Status: DC

## 2013-09-16 NOTE — Consult Note (Addendum)
Referring Physician:  Bea Laura Primary Physician: [facility] Primary Cardiologist: [facility] Reason for Consultation: Chest pain   HPI:  58 y/o male with HIV, HL, HTN but no known CAD. We are asked to consult for CP.   Previous smoker. Quit 13 years ago Smoked 2-3ppd for 30 years.   Was seen by Dr. Stanford Breed in 9/13 for exertional dyspnea and CP. ETT on 11/18/11 with good exercise tolerance and no ischemia.   He states that he had a non-productive cough for 2 weeks. Last Thursday night started not feeling well. Last Friday had episode where he felt weak, presyncopal and SOB with exertion. Chest was squeezing. Felt weak on Saturday as well. 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 Talked to his PCP who had him come to ER. Still with residual 1-2 chest tightness   ECG with SR minimal ST sagging Trop <0.30 x 2  Sister with MI at 45 y/o Father with MI  Review of Systems:     Cardiac Review of Systems: {Y] = yes [ ]  = no  Chest Pain Blue.Reese   ]  Resting SOB [   ] Exertional SOB  Blue.Reese  ]  Pontianus.Latina [  ]   Pedal Edema [   ]    Palpitations [  ] Syncope  [  ]   Presyncope [   ]  General Review of Systems: [Y] = yes [  ]=no Constitional: recent weight change [  ]; anorexia [  ]; fatigue [ y ]; nausea [  ]; night sweats [  ]; fever [  ]; or chills [  ];                                                                      Eyes : blurred vision [  ]; diplopia [   ]; vision changes [  ];  Amaurosis fugax[  ]; Resp: cough [  ];  wheezing[  ];  hemoptysis[  ];  PND [  ];  GI:  gallstones[  ], vomiting[  ];  dysphagia[  ]; melena[  ];  hematochezia [  ]; heartburn[  ];   GU: kidney stones [  ]; hematuria[  ];   dysuria [  ];  nocturia[  ]; incontinence [  ];             Skin: rash, swelling[  ];, hair loss[  ];  peripheral edema[  ];  or itching[  ]; Musculosketetal: myalgias[  ];  joint  swelling[  ];  joint erythema[  ];  joint pain[  ];  back pain[  ];  Heme/Lymph: bruising[  ];  bleeding[  ];  anemia[  ];  Neuro: TIA[  ];  headaches[  ];  stroke[  ];  vertigo[  ];  seizures[  ];   paresthesias[  ];  difficulty walking[  ];  Psych:depression[  ]; anxiety[  ];  Endocrine: diabetes[  ];  thyroid dysfunction[  ];  Other:  Past Medical History  Diagnosis Date  . Gout   . 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 Seborrheic dermatitis   .  Ulcerative colitis   . Hyperlipidemia   . HIV (human immunodeficiency virus infection)   . Dyslipidemia   . Hx of colonic polyps   . Allergic rhinitis   . Hypercholesterolemia   . Restless leg   . Diverticulitis   . Hypertension     Medications Prior to Admission  Medication Sig Dispense Refill  . allopurinol (ZYLOPRIM) 100 MG tablet Take 100 mg by mouth daily.      Marland Kitchen aspirin 81 MG tablet Take 81 mg by mouth daily.      . B Complex-C (SUPER B COMPLEX PO) Take 1 tablet by mouth daily.      . balsalazide (COLAZAL) 750 MG capsule Take 1,500 mg by mouth 2 (two) times daily.      Marland Kitchen Cod Liver Oil CAPS Take 1 capsule by mouth.      . dolutegravir (TIVICAY) 50 MG tablet Take 50 mg by mouth daily.      Marland Kitchen emtricitabine-tenofovir (TRUVADA) 200-300 MG per tablet Take 1 tablet by mouth daily.      Marland Kitchen KRILL OIL PO Take 1 tablet by mouth daily.      . Multiple Vitamin (MULTIVITAMIN) capsule Take 1 capsule by mouth daily.      Marland Kitchen oxybutynin (DITROPAN-XL) 5 MG 24 hr tablet Take 5 mg by mouth at bedtime.      Marland Kitchen rOPINIRole (REQUIP) 0.25 MG tablet Take 0.25 mg by mouth daily.         Marland Kitchen allopurinol  100 mg Oral Daily  . antiseptic oral rinse  7 mL Mouth Rinse BID  . aspirin EC  81 mg Oral Daily  . B-complex with vitamin C  1 tablet Oral q morning - 10a  . balsalazide  1,500 mg Oral BID WC  . dolutegravir  50 mg Oral Daily  . emtricitabine-tenofovir  1 tablet Oral Daily  . heparin  5,000 Units Subcutaneous 3 times per day  . multivitamin with minerals  1  tablet Oral Daily  . omega-3 acid ethyl esters  1 g Oral q morning - 10a  . oxybutynin  5 mg Oral QHS  . rOPINIRole  0.25 mg Oral QHS    Infusions: . sodium chloride 50 mL/hr at 09/16/13 0029    Allergies  Allergen Reactions  . Doxycycline Nausea Only  . Morphine And Related Other (See Comments)    Bradycardia and hypotension  . Penicillins     REACTION: rash/hives    History   Social History  . Marital Status: Single    Spouse Name: N/A    Number of Children: N/A  . Years of Education: N/A   Occupational History  . landscaping    Social History Main Topics  . Smoking status: Former Smoker -- 3.00 packs/day for 30 years    Quit date: 01/27/1994  . Smokeless tobacco: Never Used  . Alcohol Use: No     Comment: Former alcholic; none since 7902  . Drug Use: No  . Sexual Activity: Yes    Partners: Male     Comment: pt declined condoms   Other Topics Concern  . Not on file   Social History Narrative  . No narrative on file    Family History  Problem Relation Age of Onset  . Coronary artery disease Sister 34    MI  . Colon cancer Brother   . Hyperlipidemia Mother   . Hypertension Mother   . Diabetes Mother   . Colon polyps Mother   . Hyperlipidemia Father   .  Hypertension Father   . Diabetes Father   . Hyperlipidemia Sister   . Hyperlipidemia Brother   . Hypertension Brother   . Diabetes Sister   . Diabetes Brother   . Colon polyps Sister     PHYSICAL EXAM: Filed Vitals:   09/16/13 0700  BP: 139/89  Pulse: 81  Temp: 98.1 F (36.7 C)  Resp: 18     Intake/Output Summary (Last 24 hours) at 09/16/13 1036 Last data filed at 09/16/13 0700  Gross per 24 hour  Intake 375.83 ml  Output    675 ml  Net -299.17 ml    General:  Well appearing. No respiratory difficulty HEENT: normal Neck: supple. no JVD. Carotids 2+ bilat; no bruits. No lymphadenopathy or thryomegaly appreciated. Cor: PMI nondisplaced. Regular rate & rhythm. No rubs, gallops or  murmurs. Lungs: clear Abdomen: soft, nontender, nondistended. No hepatosplenomegaly. No bruits or masses. Good bowel sounds. Extremities: no cyanosis, clubbing, rash, edema Neuro: alert & oriented x 3, cranial nerves grossly intact. moves all 4 extremities w/o difficulty. Affect pleasant.  ECG: NSR 96 nonspecific ST abnormality (mild sagging)   Results for orders placed during the hospital encounter of 09/15/13 (from the past 24 hour(s))  CBC     Status: None   Collection Time    09/15/13  4:20 PM      Result Value Ref Range   WBC 6.7  4.0 - 10.5 K/uL   RBC 4.23  4.22 - 5.81 MIL/uL   Hemoglobin 13.9  13.0 - 17.0 g/dL   HCT 39.6  39.0 - 52.0 %   MCV 93.6  78.0 - 100.0 fL   MCH 32.9  26.0 - 34.0 pg   MCHC 35.1  30.0 - 36.0 g/dL   RDW 12.8  11.5 - 15.5 %   Platelets 276  150 - 400 K/uL  BASIC METABOLIC PANEL     Status: Abnormal   Collection Time    09/15/13  4:20 PM      Result Value Ref Range   Sodium 142  137 - 147 mEq/L   Potassium 4.1  3.7 - 5.3 mEq/L   Chloride 103  96 - 112 mEq/L   CO2 28  19 - 32 mEq/L   Glucose, Bld 117 (*) 70 - 99 mg/dL   BUN 9  6 - 23 mg/dL   Creatinine, Ser 1.05  0.50 - 1.35 mg/dL   Calcium 9.7  8.4 - 10.5 mg/dL   GFR calc non Af Amer 78 (*) >90 mL/min   GFR calc Af Amer 90 (*) >90 mL/min   Anion gap 11  5 - 15  I-STAT TROPOININ, ED     Status: None   Collection Time    09/15/13  4:23 PM      Result Value Ref Range   Troponin i, poc 0.00  0.00 - 0.08 ng/mL   Comment 3           MRSA PCR SCREENING     Status: None   Collection Time    09/16/13 12:22 AM      Result Value Ref Range   MRSA by PCR NEGATIVE  NEGATIVE  CBC     Status: Abnormal   Collection Time    09/16/13  1:20 AM      Result Value Ref Range   WBC 6.8  4.0 - 10.5 K/uL   RBC 3.86 (*) 4.22 - 5.81 MIL/uL   Hemoglobin 12.9 (*) 13.0 - 17.0 g/dL   HCT 36.4 (*)  39.0 - 52.0 %   MCV 94.3  78.0 - 100.0 fL   MCH 33.4  26.0 - 34.0 pg   MCHC 35.4  30.0 - 36.0 g/dL   RDW 12.8  11.5 -  15.5 %   Platelets 250  150 - 400 K/uL  CREATININE, SERUM     Status: Abnormal   Collection Time    09/16/13  1:20 AM      Result Value Ref Range   Creatinine, Ser 1.09  0.50 - 1.35 mg/dL   GFR calc non Af Amer 74 (*) >90 mL/min   GFR calc Af Amer 86 (*) >90 mL/min  TROPONIN I     Status: None   Collection Time    09/16/13  1:20 AM      Result Value Ref Range   Troponin I <0.30  <0.30 ng/mL  TROPONIN I     Status: None   Collection Time    09/16/13  8:57 AM      Result Value Ref Range   Troponin I <0.30  <0.30 ng/mL   Dg Chest 2 View  09/15/2013   CLINICAL DATA:  Chest pain .  EXAM: CHEST  2 VIEW  COMPARISON:  08/25/2005.  FINDINGS: Mediastinum and hilar structures normal. Lungs are clear. Heart size normal. No acute bony abnormality identified.  IMPRESSION: No active cardiopulmonary disease.   Electronically Signed   By: Marcello Moores  Register   On: 09/15/2013 16:49     ASSESSMENT: 1. Chest pain - concerning for unstable angina    --negative ETT 10/13 2. HIV 3. HTN 4. HL 5. Family h/opremature CAD 63. Previous tobacco use  PLAN/DISCUSSION:  History concerning for possible unstable angina with progressive exertional CP. He has multiple CRFs. ECG minimally abnormal. Trop negative x 2. We discussed repeat stress testing versus cath for definitive diagnosis. He would like to proceed with cath.  We discussed the risks and indications at length. Treat with ASA. Add heparin if CE turn +. Check lipids. Low threshold for statin.   Brycin Kille,MD 11:12 AM

## 2013-09-16 NOTE — CV Procedure (Signed)
    Cardiac Catheterization Procedure Note  Name: Joseph Johnston MRN: [id number] DOB: 12/15/1955  Procedure: Left Heart Cath, Selective Coronary Angiography, LV angiography  Indication: 58 yo WM with multiple cardiac risk factors presents with symptoms of chest pain.   Procedural Details: The right wrist was prepped, draped, and anesthetized with 1% lidocaine. Using the modified Seldinger technique, a 6 French slender sheath was introduced into the right radial artery. 3 mg of verapamil was administered through the sheath, weight-based unfractionated heparin was administered intravenously. Standard Judkins catheters were used for selective coronary angiography and left ventriculography. Catheter exchanges were performed over an exchange length guidewire. There were no immediate procedural complications. A TR band was used for radial hemostasis at the completion of the procedure.  The patient was transferred to the post catheterization recovery area for further monitoring.  Procedural Findings: Hemodynamics: AO 116/73 mean 93 mm Hg LV 119/14 mm Hg  Coronary angiography: Coronary dominance: right  Left mainstem: Normal  Left anterior descending (LAD): Normal  Left circumflex (LCx): Normal  Right coronary artery (RCA): Mild proximal irregularities less than 10-20%  Left ventriculography: Left ventricular systolic function is normal, LVEF is estimated at 55-65%, there is no significant mitral regurgitation   Final Conclusions:   1. No significant CAD 2. Normal LV function.  Recommendations: risk factor modification.  Peter Martinique, [place]  09/16/2013, 1:19 PM

## 2013-09-16 NOTE — Progress Notes (Signed)
Patient had medications from home stored in pharmacy. Medications were returned to patient. Documentation signed and placed in chart. Elmarie Shiley R

## 2013-09-16 NOTE — Progress Notes (Signed)
Patient arrived from unit post cath procedure with TR band placed to the right radial. TR band deflated following protocol. Frequent vitals set up.  Very Small amount of blood oozing from site after TR band was fully deflated and removed. Pressure applied for 20 minutes. No bleeding noted after pressure applied. Site level 0. VS stable. Will continue to monitor site closely. Roxan Hockey, RN

## 2013-09-16 NOTE — Progress Notes (Signed)
Utilization Review Completed.Donne Anon T8/21/2015

## 2013-09-16 NOTE — Progress Notes (Signed)
Patient noted to have jewelry, wallet, iPad, and cellular phone at bedside. Patient advised of valuables policy and aware staff and hospital cannot be responsible for belonging kept at bedside. Patient declines offer to lock valuables with security, verbalizes understanding.

## 2013-09-16 NOTE — Progress Notes (Signed)
Went over discharge instructions with patient and his partner. Both expressed understanding of discharge instructions. IV discontinued, patient taken off cardiac monitor. Radial site re-assessed, no bleeding, level 0. Patient discharged home. Roxan Hockey, RN

## 2013-09-16 NOTE — Progress Notes (Signed)
Pt transported to cardiac cath lab with RN and telemetry per protocol;

## 2013-09-16 NOTE — Interval H&P Note (Signed)
History and Physical Interval Note:  09/16/2013 12:32 PM  Joseph Johnston  has presented today for surgery, with the diagnosis of Unstable Angina  The various methods of treatment have been discussed with the patient and family. After consideration of risks, benefits and other options for treatment, the patient has consented to  Procedure(s): LEFT HEART CATHETERIZATION WITH CORONARY ANGIOGRAM (N/A) as a surgical intervention .  The patient's history has been reviewed, patient examined, no change in status, stable for surgery.  I have reviewed the patient's chart and labs.  Questions were answered to the patient's satisfaction.    Cath Lab Visit (complete for each Cath Lab visit)  Clinical Evaluation Leading to the Procedure:   ACS: Yes.    Non-ACS:    Anginal Classification: CCS IV  Anti-ischemic medical therapy: No Therapy  Non-Invasive Test Results: No non-invasive testing performed  Prior CABG: No previous CABG       Collier Salina [facility] 09/16/2013 12:32 PM

## 2013-09-16 NOTE — Discharge Summary (Signed)
Physician Discharge Summary  Joseph Johnston QMG:[id number] DOB: [birth year] DOA: 09/15/2013  PCP: Wyatt Haste, MD  Admit date: 09/15/2013 Discharge date: 09/16/2013  Time spent: >30 minutes  Recommendations for Outpatient Follow-up:  Follow-up Information   Follow up with Wyatt Haste, MD. (in [place], call for appt upon discharge)    Specialty:  Family Medicine   Contact information:   [address] [place] [address] [phone]        Discharge Diagnoses:  Principal Problem:   Chest pain, noncardiac>> cardiac cath negative Active Problems:   Sleep apnea   Human immunodeficiency virus (HIV) disease   HTN (hypertension)   Intermediate coronary syndrome   Discharge Condition: Improved/stable  Diet recommendation: Low sodium  Filed Weights   09/15/13 1610 09/16/13 0000  Weight: 101.606 kg (224 lb) 102.1 kg (225 lb 1.4 oz)    History of present illness:  The pt is a 58 y.o. male who presented with chest pain. Patient has no prior history of cardiac issues. He stated that the pain started about 6 days ago and appear to come on with exertion. Patient states there was some associated nausea and generalized weakness which he does not have now. Patient also felt some shortness of breath with the pain. 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 Patient has had a prior stress test which was negative about 3 years ago. Patient states that he has not smoked for thirteen years and uses no drugs and does not drink. He does have HIV and his last viral load was not detectable with a normal CD4 count.   Hospital Course:  1. Chest pain -Upon admission cardiac enzymes were cycled and came back negative  -Cardiology was consulted this a.m. and he was taken to cardiac cath>> and it showed no significant CAD, normal EF 55-65% -Cardiology recommended risk factor modification and  discharge for outpatient followup -Patient was chest pain-free at time of discharge this pm 2. Restless leg syndrome -Patient to continue Requip upon discharge 3. HIV -currently under conrtol  -continue with anti-retroviral therapy  -He states his virus load is undetectable  -He is to followup with his outpatient MDs 4. H/O Hypertension -No antihypertensive meds listed on his admission meds list -His to followup with his PCP, for further monitoring and management as appropriate.   Procedures:  Cardiac cath>> no significant CAD, normal LV function  Consultations:  Radiology  Discharge Exam: Filed Vitals:   09/16/13 1450  BP: 144/93  Pulse: 77  Temp:   Resp:     Exam:  General: alert & oriented x3In NAD Cardiovascular: RRR, nl S1 s2 Respiratory: CTAB Abdomen: soft +BS NT/ND, no masses palpable Extremities: No cyanosis and no edema    Discharge Instructions You were cared for by a hospitalist during your hospital stay. If you have any questions about your discharge medications or the care you received while you were in the hospital after you are discharged, you can call the unit and asked to speak with the hospitalist on call if the hospitalist that took care of you is not available. Once you are discharged, your primary care physician will handle any further medical issues. Please note that NO REFILLS for any discharge medications will be authorized once you are discharged, as it is imperative that you return to your primary care physician (or establish a relationship with a primary care physician if you do not have one)  for your aftercare needs so that they can reassess your need for medications and monitor your lab values.  Discharge Instructions   Diet - low sodium heart healthy    Complete by:  As directed      Increase activity slowly    Complete by:  As directed             Medication List         acetaminophen 325 MG tablet  Commonly known as:  TYLENOL   Take 2 tablets (650 mg total) by mouth every 4 (four) hours as needed for headache or mild pain.     allopurinol 100 MG tablet  Commonly known as:  ZYLOPRIM  Take 100 mg by mouth daily.     aspirin 81 MG tablet  Take 81 mg by mouth daily.     balsalazide 750 MG capsule  Commonly known as:  COLAZAL  Take 1,500 mg by mouth 2 (two) times daily.     Cod Liver Oil Caps  Take 1 capsule by mouth.     dolutegravir 50 MG tablet  Commonly known as:  TIVICAY  Take 50 mg by mouth daily.     emtricitabine-tenofovir 200-300 MG per tablet  Commonly known as:  TRUVADA  Take 1 tablet by mouth daily.     KRILL OIL PO  Take 1 tablet by mouth daily.     multivitamin capsule  Take 1 capsule by mouth daily.     oxybutynin 5 MG 24 hr tablet  Commonly known as:  DITROPAN-XL  Take 5 mg by mouth at bedtime.     rOPINIRole 0.25 MG tablet  Commonly known as:  REQUIP  Take 0.25 mg by mouth daily.     SUPER B COMPLEX PO  Take 1 tablet by mouth daily.       Allergies  Allergen Reactions  . Doxycycline Nausea Only  . Morphine And Related Other (See Comments)    Bradycardia and hypotension  . Penicillins     REACTION: rash/hives       Follow-up Information   Follow up with Wyatt Haste, MD. (in [place], call for appt upon discharge)    Specialty:  Family Medicine   Contact information:   [place] [address] [phone]        The results of significant diagnostics from this hospitalization (including imaging, microbiology, ancillary and laboratory) are listed below for reference.    Significant Diagnostic Studies: Dg Chest 2 View  09/15/2013   CLINICAL DATA:  Chest pain .  EXAM: CHEST  2 VIEW  COMPARISON:  08/25/2005.  FINDINGS: Mediastinum and hilar structures normal. Lungs are clear. Heart size normal. No acute bony abnormality identified.  IMPRESSION: No active cardiopulmonary disease.   Electronically Signed   By: Marcello Moores  Register   On:  09/15/2013 16:49    Microbiology: Recent Results (from the past 240 hour(s))  MRSA PCR SCREENING     Status: None   Collection Time    09/16/13 12:22 AM      Result Value Ref Range Status   MRSA by PCR NEGATIVE  NEGATIVE Final   Comment:            The GeneXpert MRSA Assay (FDA     approved for NASAL specimens     only), is one component of a     comprehensive MRSA colonization     surveillance program. It is not     intended to diagnose MRSA  infection nor to guide or     monitor treatment for     MRSA infections.     Labs: Basic Metabolic Panel:  Recent Labs Lab 09/15/13 1620 09/16/13 0120  NA 142  --   K 4.1  --   CL 103  --   CO2 28  --   GLUCOSE 117*  --   BUN 9  --   CREATININE 1.05 1.09  CALCIUM 9.7  --    Liver Function Tests: No results found for this basename: AST, ALT, ALKPHOS, BILITOT, PROT, ALBUMIN,  in the last 168 hours No results found for this basename: LIPASE, AMYLASE,  in the last 168 hours No results found for this basename: AMMONIA,  in the last 168 hours CBC:  Recent Labs Lab 09/15/13 1620 09/16/13 0120  WBC 6.7 6.8  HGB 13.9 12.9*  HCT 39.6 36.4*  MCV 93.6 94.3  PLT 276 250   Cardiac Enzymes:  Recent Labs Lab 09/16/13 0120 09/16/13 0857 09/16/13 1210  TROPONINI <0.30 <0.30 <0.30   BNP: BNP (last 3 results) No results found for this basename: PROBNP,  in the last 8760 hours CBG: No results found for this basename: GLUCAP,  in the last 168 hours     Signed:  Sheila Oats  [facility] 09/16/2013, 6:53 PM

## 2013-09-16 NOTE — H&P (View-Only) (Signed)
Referring Physician:  Bea Laura Primary Physician: [employer] [employer] Primary Cardiologist: [facility] Reason for Consultation: Chest pain   HPI:  58 y/o male with HIV, HL, HTN but no known CAD. We are asked to consult for CP.   Previous smoker. Quit 13 years ago Smoked 2-3ppd for 30 years.   Was seen by Dr. Stanford Breed in 9/13 for exertional dyspnea and CP. ETT on 11/18/11 with good exercise tolerance and no ischemia.   He states that he had a non-productive cough for 2 weeks. Last Thursday night started not feeling well. Last Friday had episode where he felt weak, presyncopal and SOB with exertion. Chest was squeezing. Felt weak on Saturday as well. 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 Talked to his PCP who had him come to ER. Still with residual 1-2 chest tightness   ECG with SR minimal ST sagging Trop <0.30 x 2  Sister with MI at 55 y/o Father with MI  Review of Systems:     Cardiac Review of Systems: {Y] = yes [ ]  = no  Chest Pain Blue.Reese   ]  Resting SOB [   ] Exertional SOB  Blue.Reese  ]  Pontianus.Latina [  ]   Pedal Edema [   ]    Palpitations [  ] Syncope  [  ]   Presyncope [   ]  General Review of Systems: [Y] = yes [  ]=no Constitional: recent weight change [  ]; anorexia [  ]; fatigue [ y ]; nausea [  ]; night sweats [  ]; fever [  ]; or chills [  ];                                                                      Eyes : blurred vision [  ]; diplopia [   ]; vision changes [  ];  Amaurosis fugax[  ]; Resp: cough [  ];  wheezing[  ];  hemoptysis[  ];  PND [  ];  GI:  gallstones[  ], vomiting[  ];  dysphagia[  ]; melena[  ];  hematochezia [  ]; heartburn[  ];   GU: kidney stones [  ]; hematuria[  ];   dysuria [  ];  nocturia[  ]; incontinence [  ];             Skin: rash, swelling[  ];, hair loss[  ];  peripheral edema[  ];  or itching[  ]; Musculosketetal: myalgias[  ];  joint  swelling[  ];  joint erythema[  ];  joint pain[  ];  back pain[  ];  Heme/Lymph: bruising[  ];  bleeding[  ];  anemia[  ];  Neuro: TIA[  ];  headaches[  ];  stroke[  ];  vertigo[  ];  seizures[  ];   paresthesias[  ];  difficulty walking[  ];  Psych:depression[  ]; anxiety[  ];  Endocrine: diabetes[  ];  thyroid dysfunction[  ];  Other:  Past Medical History  Diagnosis Date  . Gout   . 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 Seborrheic dermatitis   .  Ulcerative colitis   . Hyperlipidemia   . HIV (human immunodeficiency virus infection)   . Dyslipidemia   . Hx of colonic polyps   . Allergic rhinitis   . Hypercholesterolemia   . Restless leg   . Diverticulitis   . Hypertension     Medications Prior to Admission  Medication Sig Dispense Refill  . allopurinol (ZYLOPRIM) 100 MG tablet Take 100 mg by mouth daily.      Marland Kitchen aspirin 81 MG tablet Take 81 mg by mouth daily.      . B Complex-C (SUPER B COMPLEX PO) Take 1 tablet by mouth daily.      . balsalazide (COLAZAL) 750 MG capsule Take 1,500 mg by mouth 2 (two) times daily.      Marland Kitchen Cod Liver Oil CAPS Take 1 capsule by mouth.      . dolutegravir (TIVICAY) 50 MG tablet Take 50 mg by mouth daily.      Marland Kitchen emtricitabine-tenofovir (TRUVADA) 200-300 MG per tablet Take 1 tablet by mouth daily.      Marland Kitchen KRILL OIL PO Take 1 tablet by mouth daily.      . Multiple Vitamin (MULTIVITAMIN) capsule Take 1 capsule by mouth daily.      Marland Kitchen oxybutynin (DITROPAN-XL) 5 MG 24 hr tablet Take 5 mg by mouth at bedtime.      Marland Kitchen rOPINIRole (REQUIP) 0.25 MG tablet Take 0.25 mg by mouth daily.         Marland Kitchen allopurinol  100 mg Oral Daily  . antiseptic oral rinse  7 mL Mouth Rinse BID  . aspirin EC  81 mg Oral Daily  . B-complex with vitamin C  1 tablet Oral q morning - 10a  . balsalazide  1,500 mg Oral BID WC  . dolutegravir  50 mg Oral Daily  . emtricitabine-tenofovir  1 tablet Oral Daily  . heparin  5,000 Units Subcutaneous 3 times per day  . multivitamin with minerals  1  tablet Oral Daily  . omega-3 acid ethyl esters  1 g Oral q morning - 10a  . oxybutynin  5 mg Oral QHS  . rOPINIRole  0.25 mg Oral QHS    Infusions: . sodium chloride 50 mL/hr at 09/16/13 0029    Allergies  Allergen Reactions  . Doxycycline Nausea Only  . Morphine And Related Other (See Comments)    Bradycardia and hypotension  . Penicillins     REACTION: rash/hives    History   Social History  . Marital Status: Single    Spouse Name: N/A    Number of Children: N/A  . Years of Education: N/A   Occupational History  . landscaping    Social History Main Topics  . Smoking status: Former Smoker -- 3.00 packs/day for 30 years    Quit date: 01/27/1994  . Smokeless tobacco: Never Used  . Alcohol Use: No     Comment: Former alcholic; none since 7591  . Drug Use: No  . Sexual Activity: Yes    Partners: Male     Comment: pt declined condoms   Other Topics Concern  . Not on file   Social History Narrative  . No narrative on file    Family History  Problem Relation Age of Onset  . Coronary artery disease Sister 5    MI  . Colon cancer Brother   . Hyperlipidemia Mother   . Hypertension Mother   . Diabetes Mother   . Colon polyps Mother   . Hyperlipidemia Father   .  Hypertension Father   . Diabetes Father   . Hyperlipidemia Sister   . Hyperlipidemia Brother   . Hypertension Brother   . Diabetes Sister   . Diabetes Brother   . Colon polyps Sister     PHYSICAL EXAM: Filed Vitals:   09/16/13 0700  BP: 139/89  Pulse: 81  Temp: 98.1 F (36.7 C)  Resp: 18     Intake/Output Summary (Last 24 hours) at 09/16/13 1036 Last data filed at 09/16/13 0700  Gross per 24 hour  Intake 375.83 ml  Output    675 ml  Net -299.17 ml    General:  Well appearing. No respiratory difficulty HEENT: normal Neck: supple. no JVD. Carotids 2+ bilat; no bruits. No lymphadenopathy or thryomegaly appreciated. Cor: PMI nondisplaced. Regular rate & rhythm. No rubs, gallops or  murmurs. Lungs: clear Abdomen: soft, nontender, nondistended. No hepatosplenomegaly. No bruits or masses. Good bowel sounds. Extremities: no cyanosis, clubbing, rash, edema Neuro: alert & oriented x 3, cranial nerves grossly intact. moves all 4 extremities w/o difficulty. Affect pleasant.  ECG: NSR 96 nonspecific ST abnormality (mild sagging)   Results for orders placed during the hospital encounter of 09/15/13 (from the past 24 hour(s))  CBC     Status: None   Collection Time    09/15/13  4:20 PM      Result Value Ref Range   WBC 6.7  4.0 - 10.5 K/uL   RBC 4.23  4.22 - 5.81 MIL/uL   Hemoglobin 13.9  13.0 - 17.0 g/dL   HCT 39.6  39.0 - 52.0 %   MCV 93.6  78.0 - 100.0 fL   MCH 32.9  26.0 - 34.0 pg   MCHC 35.1  30.0 - 36.0 g/dL   RDW 12.8  11.5 - 15.5 %   Platelets 276  150 - 400 K/uL  BASIC METABOLIC PANEL     Status: Abnormal   Collection Time    09/15/13  4:20 PM      Result Value Ref Range   Sodium 142  137 - 147 mEq/L   Potassium 4.1  3.7 - 5.3 mEq/L   Chloride 103  96 - 112 mEq/L   CO2 28  19 - 32 mEq/L   Glucose, Bld 117 (*) 70 - 99 mg/dL   BUN 9  6 - 23 mg/dL   Creatinine, Ser 1.05  0.50 - 1.35 mg/dL   Calcium 9.7  8.4 - 10.5 mg/dL   GFR calc non Af Amer 78 (*) >90 mL/min   GFR calc Af Amer 90 (*) >90 mL/min   Anion gap 11  5 - 15  I-STAT TROPOININ, ED     Status: None   Collection Time    09/15/13  4:23 PM      Result Value Ref Range   Troponin i, poc 0.00  0.00 - 0.08 ng/mL   Comment 3           MRSA PCR SCREENING     Status: None   Collection Time    09/16/13 12:22 AM      Result Value Ref Range   MRSA by PCR NEGATIVE  NEGATIVE  CBC     Status: Abnormal   Collection Time    09/16/13  1:20 AM      Result Value Ref Range   WBC 6.8  4.0 - 10.5 K/uL   RBC 3.86 (*) 4.22 - 5.81 MIL/uL   Hemoglobin 12.9 (*) 13.0 - 17.0 g/dL   HCT 36.4 (*)  39.0 - 52.0 %   MCV 94.3  78.0 - 100.0 fL   MCH 33.4  26.0 - 34.0 pg   MCHC 35.4  30.0 - 36.0 g/dL   RDW 12.8  11.5 -  15.5 %   Platelets 250  150 - 400 K/uL  CREATININE, SERUM     Status: Abnormal   Collection Time    09/16/13  1:20 AM      Result Value Ref Range   Creatinine, Ser 1.09  0.50 - 1.35 mg/dL   GFR calc non Af Amer 74 (*) >90 mL/min   GFR calc Af Amer 86 (*) >90 mL/min  TROPONIN I     Status: None   Collection Time    09/16/13  1:20 AM      Result Value Ref Range   Troponin I <0.30  <0.30 ng/mL  TROPONIN I     Status: None   Collection Time    09/16/13  8:57 AM      Result Value Ref Range   Troponin I <0.30  <0.30 ng/mL   Dg Chest 2 View  09/15/2013   CLINICAL DATA:  Chest pain .  EXAM: CHEST  2 VIEW  COMPARISON:  08/25/2005.  FINDINGS: Mediastinum and hilar structures normal. Lungs are clear. Heart size normal. No acute bony abnormality identified.  IMPRESSION: No active cardiopulmonary disease.   Electronically Signed   By: Marcello Moores  Register   On: 09/15/2013 16:49     ASSESSMENT: 1. Chest pain - concerning for unstable angina    --negative ETT 10/13 2. HIV 3. HTN 4. HL 5. Family h/opremature CAD 22. Previous tobacco use  PLAN/DISCUSSION:  History concerning for possible unstable angina with progressive exertional CP. He has multiple CRFs. ECG minimally abnormal. Trop negative x 2. We discussed repeat stress testing versus cath for definitive diagnosis. He would like to proceed with cath.  We discussed the risks and indications at length. Treat with ASA. Add heparin if CE turn +. Check lipids. Low threshold for statin.   Jaydyn Menon,MD 11:12 AM

## 2013-09-20 ENCOUNTER — Encounter: Payer: Self-pay | Admitting: Family Medicine

## 2013-09-20 ENCOUNTER — Ambulatory Visit (INDEPENDENT_AMBULATORY_CARE_PROVIDER_SITE_OTHER): Payer: Federal, State, Local not specified - PPO | Admitting: Family Medicine

## 2013-09-20 ENCOUNTER — Telehealth: Payer: Self-pay | Admitting: Internal Medicine

## 2013-09-20 VITALS — BP 120/78 | HR 60 | Wt 223.0 lb

## 2013-09-20 DIAGNOSIS — Z23 Encounter for immunization: Secondary | ICD-10-CM

## 2013-09-20 DIAGNOSIS — R0602 Shortness of breath: Secondary | ICD-10-CM

## 2013-09-20 NOTE — Progress Notes (Signed)
   Subjective:    Patient ID: Joseph Johnston, male    DOB: 1955/05/17, 58 y.o.   MRN: [id number]  HPI He is here for followup visit. He was recently admitted to the hospital and evaluated for shortness of breath and chest discomfort. The cardiac cath was negative. Blood work was also reviewed was essentially negative. He does continue to complain of shortness of breath with physical activity and also when he bends over. He has tried albuterol in the past and stated he had unacceptable side effects of rapid heart rate.   Review of Systems     Objective:   Physical Exam Alert and in no distress otherwise not examined       Assessment & Plan:  Need for prophylactic vaccination against Streptococcus pneumoniae (pneumococcus) - Plan: Pneumococcal conjugate vaccine 13-valent  Need for prophylactic vaccination and inoculation against influenza - Plan: Flu Vaccine QUAD 36+ mos IM  SOB (shortness of breath) - Plan: Pulmonary function test  nothing else seems to fit as to why he would be short of breath since his cath was normal, blood work was normal, chest x-ray normal. Check PFT

## 2013-09-20 NOTE — Telephone Encounter (Signed)
Pt is scheduled for Wednesday September 2, @ 4:00pm for PFT at [facility]. I did leave a Detailed message on his phone letting him know of appt time and date with address

## 2013-09-21 ENCOUNTER — Ambulatory Visit (INDEPENDENT_AMBULATORY_CARE_PROVIDER_SITE_OTHER): Payer: Federal, State, Local not specified - PPO | Admitting: *Deleted

## 2013-09-21 ENCOUNTER — Encounter: Payer: Self-pay | Admitting: *Deleted

## 2013-09-21 ENCOUNTER — Telehealth: Payer: Self-pay | Admitting: Internal Medicine

## 2013-09-21 VITALS — BP 136/88 | HR 73 | Temp 97.9°F | Resp 16 | Ht 71.0 in | Wt 224.5 lb

## 2013-09-21 DIAGNOSIS — B20 Human immunodeficiency virus [HIV] disease: Secondary | ICD-10-CM

## 2013-09-21 DIAGNOSIS — Z006 Encounter for examination for normal comparison and control in clinical research program: Secondary | ICD-10-CM

## 2013-09-21 LAB — LIPID PANEL
Cholesterol: 173 mg/dL (ref 0–200)
HDL: 34 mg/dL — ABNORMAL LOW (ref >39)
LDL Cholesterol: 108 mg/dL — ABNORMAL HIGH (ref 0–99)
Total CHOL/HDL Ratio: 5.1 Ratio
Triglycerides: 155 mg/dL — ABNORMAL HIGH (ref <150)
VLDL: 31 mg/dL (ref 0–40)

## 2013-09-21 LAB — COMPREHENSIVE METABOLIC PANEL
ALBUMIN: 4.9 g/dL (ref 3.5–5.2)
ALK PHOS: 79 U/L (ref 39–117)
ALT: 19 U/L (ref 0–53)
AST: 22 U/L (ref 0–37)
BUN: 12 mg/dL (ref 6–23)
CO2: 27 mEq/L (ref 19–32)
Calcium: 9.2 mg/dL (ref 8.4–10.5)
Chloride: 103 mEq/L (ref 96–112)
Creat: 0.99 mg/dL (ref 0.50–1.35)
GLUCOSE: 97 mg/dL (ref 70–99)
POTASSIUM: 4.2 meq/L (ref 3.5–5.3)
SODIUM: 139 meq/L (ref 135–145)
TOTAL PROTEIN: 7.6 g/dL (ref 6.0–8.3)
Total Bilirubin: 0.7 mg/dL (ref 0.2–1.2)

## 2013-09-21 LAB — HEPATITIS B SURFACE ANTIBODY,QUALITATIVE: Hep B S Ab: POSITIVE — AB

## 2013-09-21 LAB — HEPATITIS C ANTIBODY: HCV AB: NEGATIVE

## 2013-09-21 LAB — HEPATITIS B CORE ANTIBODY, TOTAL: HEP B C TOTAL AB: REACTIVE — AB

## 2013-09-21 LAB — HEPATITIS B SURFACE ANTIGEN: Hepatitis B Surface Ag: NEGATIVE

## 2013-09-21 NOTE — Telephone Encounter (Signed)
Left message for pt to call back  °

## 2013-09-22 LAB — HIGH SENSITIVITY CRP: CRP, High Sensitivity: 2.5 mg/L

## 2013-09-22 NOTE — Progress Notes (Signed)
Joseph Johnston is here for A5314 screening. We reviewed the informed consent together. I fully explained the consent, risks, benefits, responsibilities, and answered questions. Participant verbalized understanding and signed the consent witnessed by me. I gave a copy of the signed consent to participant. Fasting blood was drawn with no problems. CXR pending results of blood work. Medical history, medication, and signs and symptoms were reviewed. Vital signs obtained and are stable. Complete exam performed.     PCV13 received on 09/20/2013 PPV23 received 11/10/2011  Exam: Neuro: A&Ox4.  Resp: All lobes/bases clear. Symmetrical expansion. Respirations normal. CV: NSR; no murmurs, gallops. Skin pink with <2 sec cap refill. Negative for edema. +2 pulses in all extremities Abd: Normal bowel sounds in all 4 quadrants. States he has erosion of colon/Ulcerative Colitis. MS: Normal ROM. 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 Normal gait, Normal, equal strength in all extremities. Lymph: Nonpalpable Skin: Intact  He received $50 gift card for visit. Entry appointment scheduled for 10/19/2013 @ 8:30am. Will schedule CXR and BART exam pending lab results.

## 2013-09-25 LAB — QUANTIFERON TB GOLD ASSAY (BLOOD)
Interferon Gamma Release Assay: NEGATIVE
Mitogen value: >10 IU/mL
Quantiferon Nil Value: 0.03 IU/mL
Quantiferon Tb Ag Minus Nil Value: 0 IU/mL
TB Ag value: 0.03 IU/mL

## 2013-09-26 ENCOUNTER — Encounter: Payer: Self-pay | Admitting: Family Medicine

## 2013-09-26 ENCOUNTER — Ambulatory Visit (INDEPENDENT_AMBULATORY_CARE_PROVIDER_SITE_OTHER): Payer: Federal, State, Local not specified - PPO | Admitting: Family Medicine

## 2013-09-26 VITALS — BP 136/84 | HR 80 | Temp 101.7°F

## 2013-09-26 DIAGNOSIS — J020 Streptococcal pharyngitis: Secondary | ICD-10-CM

## 2013-09-26 DIAGNOSIS — J0300 Acute streptococcal tonsillitis, unspecified: Secondary | ICD-10-CM

## 2013-09-26 DIAGNOSIS — R509 Fever, unspecified: Secondary | ICD-10-CM

## 2013-09-26 DIAGNOSIS — J029 Acute pharyngitis, unspecified: Secondary | ICD-10-CM

## 2013-09-26 LAB — POCT RAPID STREP A (OFFICE): Rapid Strep A Screen: POSITIVE — AB

## 2013-09-26 MED ORDER — CLARITHROMYCIN 500 MG PO TABS: 500.0000 mg | ORAL_TABLET | Freq: Two times a day (BID) | ORAL | Status: DC

## 2013-09-26 NOTE — Patient Instructions (Signed)
Strep Throat Strep throat is an infection of the throat caused by a bacteria named Streptococcus pyogenes. Your health care provider may call the infection streptococcal "tonsillitis" or "pharyngitis" depending on whether there are signs of inflammation in the tonsils or back of the throat. Strep throat is most common in children aged 56-15 years during the cold months of the year, but it can occur in people of any age during any season. This infection is spread from person to person (contagious) through coughing, sneezing, or other close contact. SIGNS AND SYMPTOMS   Fever or chills.  Painful, swollen, red tonsils or throat.  Pain or difficulty when swallowing.  White or yellow spots on the tonsils or throat.  Swollen, tender lymph nodes or "glands" of the neck or under the jaw.  Red rash all over the body (rare). DIAGNOSIS  Many different infections can cause the same symptoms. A test must be done to confirm the diagnosis so the right treatment can be given. A "rapid strep test" can help your health care provider make the diagnosis in a few minutes. If this test is not available, a light swab of the infected area can be used for a throat culture test. If a throat culture test is done, results are usually available in a day or two. TREATMENT  Strep throat is treated with antibiotic medicine. HOME CARE INSTRUCTIONS   Gargle with 1 tsp of salt in 1 cup of warm water, 3-4 times per day or as needed for comfort.  Family members who also have a sore throat or fever should be tested for strep throat and treated with antibiotics if they have the strep infection.  Make sure everyone in your household washes their hands well.  Do not share food, drinking cups, or personal items that could cause the infection to spread to others.  You may need to eat a soft food diet until your sore throat gets better.  Drink enough water and fluids to keep your urine clear or pale yellow. This will help prevent  dehydration.  Get plenty of rest.  Stay home from school, day care, or work until you have been on antibiotics for 24 hours.  Take medicines only as directed by your health care provider.  Take your antibiotic medicine as directed by your health care provider. Finish it even if you start to feel better. SEEK MEDICAL CARE IF:   The glands in your neck continue to enlarge.  You develop a rash, cough, or earache.  You cough up green, yellow-brown, or bloody sputum.  You have pain or discomfort not controlled by medicines.  Your problems seem to be getting worse rather than better.  You have a fever. SEEK IMMEDIATE MEDICAL CARE IF:   You develop any new symptoms such as vomiting, severe headache, stiff or painful neck, chest pain, shortness of breath, or trouble swallowing.  You develop severe throat pain, drooling, or changes in your voice.  You develop swelling of the neck, or the skin on the neck becomes red and tender.  You develop signs of dehydration, such as fatigue, dry mouth, and decreased urination.  You become increasingly sleepy, or you cannot wake up completely. MAKE SURE YOU:  Understand these instructions.  Will watch your condition.  Will get help right away if you are not doing well or get worse. Document Released: 01/11/2000 Document Revised: 05/30/2013 Document Reviewed: 03/14/2010 [facility] Patient Information 2015 [place], [place]. This information is not intended to replace advice given to you by  your health care provider. Make sure you discuss any questions you have with your health care provider. Take 4 Advil 3 times per day and you can also take Tylenol if you need to.

## 2013-09-26 NOTE — Progress Notes (Signed)
   Subjective:    Patient ID: Joseph Johnston, male    DOB: 05/09/1955, 58 y.o.   MRN: [id number]  HPI Last night he did some stretching and felt some right posterior neck pain followed by chest tightness, sore throat,  Chills. He also had right calf pain. He took 600 mg of Advil and got no benefit.   Review of Systems     Objective:   Physical Exam alert and in no distress. Tympanic membranes and canals are normal. Throat is clear. Tonsils are slightly erythematous. Neck is supple without adenopathy or thyromegaly. Cardiac exam shows a regular sinus rhythm without murmurs or gallops. Lungs are clear to auscultation.  Strep screen positive      Assessment & Plan:  Sore throat - Plan: Rapid Strep A  Chills with fever - Plan: Rapid Strep A  Acute streptococcal tonsillitis - Plan: clarithromycin (BIAXIN) 500 MG tablet

## 2013-09-28 NOTE — Telephone Encounter (Signed)
Pt wanted to know when he is due for a colon. Per last OV note pt is due for colon in October of 2017, pt aware.

## 2013-10-02 ENCOUNTER — Other Ambulatory Visit: Payer: Self-pay | Admitting: Family Medicine

## 2013-10-04 ENCOUNTER — Other Ambulatory Visit: Payer: Self-pay | Admitting: *Deleted

## 2013-10-04 DIAGNOSIS — Z006 Encounter for examination for normal comparison and control in clinical research program: Secondary | ICD-10-CM

## 2013-10-04 NOTE — Telephone Encounter (Signed)
Is this okay?

## 2013-10-05 NOTE — Telephone Encounter (Signed)
DR.LALONDE IS THIS OKAY 

## 2013-10-10 ENCOUNTER — Ambulatory Visit (HOSPITAL_COMMUNITY)
Admission: RE | Admit: 2013-10-10 | Discharge: 2013-10-10 | Disposition: A | Discharge status: Home / Self Care | Payer: Federal, State, Local not specified - PPO | Source: Ambulatory Visit | Attending: Infectious Disease | Admitting: Infectious Disease

## 2013-10-10 DIAGNOSIS — Z006 Encounter for examination for normal comparison and control in clinical research program: Secondary | ICD-10-CM

## 2013-10-11 ENCOUNTER — Ambulatory Visit: Payer: Federal, State, Local not specified - PPO | Admitting: Internal Medicine

## 2013-10-11 DIAGNOSIS — R0602 Shortness of breath: Secondary | ICD-10-CM

## 2013-10-11 LAB — PULMONARY FUNCTION TEST
DL/VA % pred: 108 %
DL/VA: 5.05 ml/min/mmHg/L
DLCO UNC % PRED: 94 %
DLCO UNC: 30.71 ml/min/mmHg
FEF 25-75 PRE: 3.51 L/s
FEF 25-75 Post: 3.43 L/sec
FEF2575-%Change-Post: -2 %
FEF2575-%Pred-Post: 108 %
FEF2575-%Pred-Pre: 111 %
FEV1-%CHANGE-POST: 0 %
FEV1-%PRED-POST: 86 %
FEV1-%Pred-Pre: 86 %
FEV1-Post: 3.23 L
FEV1-Pre: 3.24 L
FEV1FVC-%Change-Post: 2 %
FEV1FVC-%Pred-Pre: 109 %
FEV6-%Change-Post: -2 %
FEV6-%PRED-POST: 80 %
FEV6-%PRED-PRE: 82 %
FEV6-POST: 3.76 L
FEV6-Pre: 3.87 L
FEV6FVC-%PRED-POST: 104 %
FEV6FVC-%PRED-PRE: 104 %
FVC-%Change-Post: -3 %
FVC-%PRED-PRE: 79 %
FVC-%Pred-Post: 77 %
FVC-POST: 3.76 L
FVC-Pre: 3.88 L
POST FEV6/FVC RATIO: 100 %
PRE FEV6/FVC RATIO: 100 %
Post FEV1/FVC ratio: 86 %
Pre FEV1/FVC ratio: 84 %
RV % pred: 86 %
RV: 1.87 L
TLC % pred: 89 %
TLC: 6.26 L

## 2013-10-11 NOTE — Progress Notes (Signed)
PFT done today. 

## 2013-10-17 ENCOUNTER — Other Ambulatory Visit: Payer: Self-pay | Admitting: Family Medicine

## 2013-11-04 ENCOUNTER — Other Ambulatory Visit: Payer: Self-pay | Admitting: Family Medicine

## 2013-11-08 ENCOUNTER — Telehealth: Payer: Self-pay | Admitting: Internal Medicine

## 2013-11-08 MED ORDER — EMTRICITABINE-TENOFOVIR DF 200-300 MG PO TABS: ORAL_TABLET | ORAL | Status: DC

## 2013-11-08 MED ORDER — DOLUTEGRAVIR SODIUM 50 MG PO TABS: 50.0000 mg | ORAL_TABLET | Freq: Every day | ORAL | Status: DC

## 2013-11-08 NOTE — Telephone Encounter (Signed)
Pt needs a refill on the tivicay 50mg  to [facility]

## 2013-11-24 ENCOUNTER — Other Ambulatory Visit: Payer: Self-pay | Admitting: Family Medicine

## 2014-01-01 ENCOUNTER — Other Ambulatory Visit: Payer: Self-pay | Admitting: Family Medicine

## 2014-01-05 ENCOUNTER — Encounter (HOSPITAL_COMMUNITY): Payer: Self-pay | Admitting: Cardiology

## 2014-02-03 ENCOUNTER — Other Ambulatory Visit: Payer: Self-pay | Admitting: Family Medicine

## 2014-02-03 NOTE — Telephone Encounter (Signed)
Is this okay to refill? 

## 2014-03-09 ENCOUNTER — Ambulatory Visit (INDEPENDENT_AMBULATORY_CARE_PROVIDER_SITE_OTHER): Payer: Federal, State, Local not specified - PPO | Admitting: Family Medicine

## 2014-03-09 ENCOUNTER — Encounter: Payer: Self-pay | Admitting: Family Medicine

## 2014-03-09 VITALS — BP 126/84 | HR 82 | Wt 235.0 lb

## 2014-03-09 DIAGNOSIS — I10 Essential (primary) hypertension: Secondary | ICD-10-CM

## 2014-03-09 DIAGNOSIS — B20 Human immunodeficiency virus [HIV] disease: Secondary | ICD-10-CM

## 2014-03-09 DIAGNOSIS — N4889 Other specified disorders of penis: Secondary | ICD-10-CM

## 2014-03-09 DIAGNOSIS — N489 Disorder of penis, unspecified: Secondary | ICD-10-CM

## 2014-03-09 DIAGNOSIS — M109 Gout, unspecified: Secondary | ICD-10-CM

## 2014-03-09 DIAGNOSIS — E785 Hyperlipidemia, unspecified: Secondary | ICD-10-CM

## 2014-03-09 NOTE — Progress Notes (Signed)
   Subjective:    Patient ID: Joseph Johnston, male    DOB: [birth year], 58 y.o.   MRN: [id number]  HPI He is here for evaluation of a lesion on his penis. He knows to several days ago. It is slightly uncomfortable. He has never had this before. Sexual activity is only with his partner. No discharge or dysuria Review his record indicates he has not had follow-up on his underlying HIV in several months. He has had no difficulty with the medications no fever, chills, headache, weight change.   Review of Systems     Objective:   Physical Exam Alert and in no distress. Tympanic membranes and canals are normal. Pharyngeal area is normal. Neck is supple without adenopathy or thyromegaly. Cardiac exam shows a regular sinus rhythm without murmurs or gallops. Lungs are clear to auscultation. Abdominal exam shows no masses or tenderness. No axillary or inguinal adenopathy is noted. Genital exam shows a 0.5 cm ulcerated lesion present on the shaft of the penis near the corona.      Assessment & Plan:  Hyperlipidemia with target LDL less than 100 - Plan: Lipid panel  Human immunodeficiency virus (HIV) disease - Plan: CBC with Differential/Platelet, Comprehensive metabolic panel, HIV 1 RNA quant-no reflex-bld, T-helper cells (CD4) count, CANCELED: T-helper cells (CD4) count  Essential hypertension - Plan: CBC with Differential/Platelet, Comprehensive metabolic panel  Gout without tophus, unspecified cause, unspecified chronicity, unspecified site - Plan: Uric Acid  Penile lesion - Plan: RPR, Viral culture  I doubt this is STD related but will check. Will also make sure that this is not herpetic.

## 2014-03-10 ENCOUNTER — Other Ambulatory Visit: Payer: Self-pay | Admitting: Family Medicine

## 2014-03-10 LAB — COMPREHENSIVE METABOLIC PANEL
ALK PHOS: 85 U/L (ref 39–117)
ALT: 23 U/L (ref 0–53)
AST: 27 U/L (ref 0–37)
Albumin: 4.6 g/dL (ref 3.5–5.2)
BILIRUBIN TOTAL: 0.7 mg/dL (ref 0.2–1.2)
BUN: 11 mg/dL (ref 6–23)
CO2: 24 mEq/L (ref 19–32)
Calcium: 9.7 mg/dL (ref 8.4–10.5)
Chloride: 103 mEq/L (ref 96–112)
Creat: 0.89 mg/dL (ref 0.50–1.35)
Glucose, Bld: 104 mg/dL — ABNORMAL HIGH (ref 70–99)
Potassium: 4.5 mEq/L (ref 3.5–5.3)
SODIUM: 139 meq/L (ref 135–145)
TOTAL PROTEIN: 7.6 g/dL (ref 6.0–8.3)

## 2014-03-10 LAB — FLUORESCENT TREPONEMAL AB(FTA)-IGG-BLD: Fluorescent Treponemal ABS: REACTIVE — AB

## 2014-03-10 LAB — CBC WITH DIFFERENTIAL/PLATELET
BASOS ABS: 0 10*3/uL (ref 0.0–0.1)
BASOS PCT: 0 % (ref 0–1)
EOS PCT: 1 % (ref 0–5)
Eosinophils Absolute: 0.1 10*3/uL (ref 0.0–0.7)
HCT: 41.9 % (ref 39.0–52.0)
Hemoglobin: 14.2 g/dL (ref 13.0–17.0)
LYMPHS PCT: 39 % (ref 12–46)
Lymphs Abs: 3.4 10*3/uL (ref 0.7–4.0)
MCH: 32.3 pg (ref 26.0–34.0)
MCHC: 33.9 g/dL (ref 30.0–36.0)
MCV: 95.4 fL (ref 78.0–100.0)
MPV: 8.9 fL (ref 8.6–12.4)
Monocytes Absolute: 0.4 10*3/uL (ref 0.1–1.0)
Monocytes Relative: 5 % (ref 3–12)
Neutro Abs: 4.7 10*3/uL (ref 1.7–7.7)
Neutrophils Relative %: 55 % (ref 43–77)
PLATELETS: 375 10*3/uL (ref 150–400)
RBC: 4.39 MIL/uL (ref 4.22–5.81)
RDW: 13.8 % (ref 11.5–15.5)
WBC: 8.6 10*3/uL (ref 4.0–10.5)

## 2014-03-10 LAB — HIV-1 RNA QUANT-NO REFLEX-BLD
HIV 1 RNA Quant: <20 copies/mL (ref <20)
HIV-1 RNA Quant, Log: <1.3 {Log} (ref <1.30)

## 2014-03-10 LAB — RPR TITER: RPR Titer: 1:1024 {titer} — AB

## 2014-03-10 LAB — URIC ACID: URIC ACID, SERUM: 6.6 mg/dL (ref 4.0–7.8)

## 2014-03-10 LAB — T-HELPER CELLS (CD4) COUNT (NOT AT ARMC)
ABSOLUTE CD4: 1308 /uL (ref 381–1469)
CD4 T HELPER %: 39 % (ref 32–62)
TOTAL LYMPHOCYTE COUNT: 3354 /uL — AB (ref 700–3300)
Total Lymphocyte: 39 % (ref 12–46)
WBC, lymph enumeration: 8.6 10*3/uL (ref 4.0–10.5)

## 2014-03-10 LAB — LIPID PANEL
CHOL/HDL RATIO: 6.9 ratio
Cholesterol: 206 mg/dL — ABNORMAL HIGH (ref 0–200)
HDL: 30 mg/dL — ABNORMAL LOW (ref >39)
LDL Cholesterol: 123 mg/dL — ABNORMAL HIGH (ref 0–99)
Triglycerides: 266 mg/dL — ABNORMAL HIGH (ref <150)
VLDL: 53 mg/dL — AB (ref 0–40)

## 2014-03-10 LAB — RPR: RPR Ser Ql: REACTIVE — AB

## 2014-03-13 ENCOUNTER — Ambulatory Visit (INDEPENDENT_AMBULATORY_CARE_PROVIDER_SITE_OTHER): Payer: Federal, State, Local not specified - PPO | Admitting: Family Medicine

## 2014-03-13 DIAGNOSIS — A539 Syphilis, unspecified: Secondary | ICD-10-CM

## 2014-03-13 MED ORDER — AZITHROMYCIN 500 MG PO TABS: ORAL_TABLET | ORAL | Status: DC

## 2014-03-13 NOTE — Progress Notes (Signed)
   Subjective:    Patient ID: Joseph Johnston, male    DOB: [birth year], 59 y.o.   MRN: [id number]  HPI He is here for a recheck. Recent blood testing did show syphilis. On his last visit he had evidence of a chancre. He now states that the sexual activity was approximately one month ago. The lesion on his penis was pain-free. Review his record indicates she does get a rash and itching with penicillin.   Review of Systems     Objective:   Physical Exam Alert and in no distress otherwise not examined       Assessment & Plan:  Syphilis - Plan: azithromycin (ZITHROMAX) 500 MG tablet  he is to return here in 3 months. I will also discuss this with ID

## 2014-03-14 ENCOUNTER — Other Ambulatory Visit: Payer: Self-pay | Admitting: Family Medicine

## 2014-03-14 DIAGNOSIS — A539 Syphilis, unspecified: Secondary | ICD-10-CM

## 2014-03-14 MED ORDER — DOXYCYCLINE HYCLATE 100 MG PO TABS: 100.0000 mg | ORAL_TABLET | Freq: Two times a day (BID) | ORAL | Status: DC

## 2014-03-14 NOTE — Progress Notes (Signed)
His case was discussed with Dr. Megan Salon. 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

## 2014-03-16 LAB — HERPES SIMPLEX VIRUS CULTURE: Organism ID, Bacteria: NOT DETECTED

## 2014-03-27 ENCOUNTER — Telehealth: Payer: Self-pay | Admitting: Family Medicine

## 2014-03-27 NOTE — Telephone Encounter (Signed)
Joseph Johnston with [facility] of [facility] Dept called to follow up on treatment on pt.  Advised of treatment

## 2014-04-20 ENCOUNTER — Other Ambulatory Visit: Payer: Self-pay | Admitting: Medical

## 2014-04-20 ENCOUNTER — Ambulatory Visit
Admission: RE | Admit: 2014-04-20 | Discharge: 2014-04-20 | Disposition: A | Discharge status: Home / Self Care | Payer: Federal, State, Local not specified - PPO | Source: Ambulatory Visit | Attending: Medical | Admitting: Medical

## 2014-04-20 ENCOUNTER — Ambulatory Visit (INDEPENDENT_AMBULATORY_CARE_PROVIDER_SITE_OTHER): Payer: Federal, State, Local not specified - PPO | Admitting: Medical

## 2014-04-20 ENCOUNTER — Encounter: Payer: Self-pay | Admitting: Medical

## 2014-04-20 VITALS — BP 140/88 | HR 80 | Temp 98.2°F | Resp 16 | Wt 238.0 lb

## 2014-04-20 DIAGNOSIS — R6889 Other general symptoms and signs: Secondary | ICD-10-CM

## 2014-04-20 DIAGNOSIS — R059 Cough, unspecified: Secondary | ICD-10-CM

## 2014-04-20 DIAGNOSIS — B20 Human immunodeficiency virus [HIV] disease: Secondary | ICD-10-CM

## 2014-04-20 DIAGNOSIS — R05 Cough: Secondary | ICD-10-CM

## 2014-04-20 DIAGNOSIS — R0602 Shortness of breath: Secondary | ICD-10-CM

## 2014-04-20 LAB — POC INFLUENZA A&B (BINAX/QUICKVUE)
Influenza A, POC: NEGATIVE
Influenza B, POC: NEGATIVE

## 2014-04-20 MED ORDER — METHYLPREDNISOLONE (PAK) 4 MG PO TABS: ORAL_TABLET | ORAL | Status: DC

## 2014-04-20 MED ORDER — ALBUTEROL SULFATE HFA 108 (90 BASE) MCG/ACT IN AERS: 2.0000 | INHALATION_SPRAY | Freq: Four times a day (QID) | RESPIRATORY_TRACT | Status: DC | PRN

## 2014-04-20 MED ORDER — LEVOFLOXACIN 500 MG PO TABS: 500.0000 mg | ORAL_TABLET | Freq: Every day | ORAL | Status: DC

## 2014-04-20 NOTE — Addendum Note (Signed)
Addended by: Louie Bun on: 04/20/2014 01:14 PM   Modules accepted: Orders

## 2014-04-20 NOTE — Progress Notes (Signed)
Subjective: Here for illness.  Been sick 1.5 week, felt a little better last Friday, but partner got sick about that time.   Then he started feeling bad again this week.  Both he and his partner got seen by urgent care 5 days ago, partner was diagnosed with flu and pneumonia, and Joseph Johnston was diagnosed with possible flu.   So currently symptoms include cough, worsening cough, decreased energy, no appetite, chest rattly and congestion, tickle in throat, nausea, headache and sinus pressure, some sneezing, feels somewhat SOB.  Has had some diarrhea since last Saturday off an on.  Denies fever, vomiting, ear popping or ear pain.  Water intake is fine.  Neither he nor partner have been hospitalized recently.  He just completed a 30 day course of azithromycin for syphilis in February.   No other aggravating or relieving factors. No other complaint.  Past Medical History  Diagnosis Date  . Gout   . 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 Seborrheic dermatitis   . Ulcerative colitis   . Hyperlipidemia   . HIV (human immunodeficiency virus infection)   . Dyslipidemia   . Hx of colonic polyps   . Allergic rhinitis   . Hypercholesterolemia   . Restless leg   . Diverticulitis   . Hypertension    ROS as in subjective  Objective: BP 140/88 mmHg  Pulse 80  Temp(Src) 98.2 F (36.8 C) (Oral)  Resp 16  Wt 238 lb (107.956 kg)  General appearance: alert, no distress, WD/WN HEENT: normocephalic, sclerae anicteric, TMs pearly, nares inflammmed, mucoid discharge, turbinated edema, + erythema, pharynx with post nasal drainage Oral cavity: MMM, no lesions Neck: supple, shoddy tender nodes anteriorly, no thyromegaly, no masses Heart: RRR, normal S1, S2, no murmurs Lungs: decreased breath sounds in general, no wheezes, rhonchi, or rales Pulses: 2+ symmetric, upper and lower extremities, normal cap refill Skin: warm, dry, no rash   Assessment: Encounter Diagnoses  Name Primary?  . Cough Yes  . SOB (shortness of  breath)   . Flu-like symptoms   . HIV disease    Plan: We discussed his symptoms, sick contacts, recent 30 day round of antibiotics for syphilis positive.  We will send him for chest x-ray, prescribed albuterol for shortness of breath and tightness, increase water intake, begin Mucinex DM over-the-counter, and we will call a chest x-ray results.  This may just be residual decreased energy and tightness in the chest from influenza illness as it sounds like he has been sick with the flu the last week and a half, but would be preferable to avoid antibiotics chest x-ray is normal.

## 2014-05-08 ENCOUNTER — Other Ambulatory Visit: Payer: Self-pay | Admitting: Family Medicine

## 2014-06-12 ENCOUNTER — Ambulatory Visit (INDEPENDENT_AMBULATORY_CARE_PROVIDER_SITE_OTHER): Payer: Federal, State, Local not specified - PPO | Admitting: Family Medicine

## 2014-06-12 ENCOUNTER — Encounter: Payer: Self-pay | Admitting: Family Medicine

## 2014-06-12 VITALS — BP 140/100 | HR 72 | Wt 239.2 lb

## 2014-06-12 DIAGNOSIS — A539 Syphilis, unspecified: Secondary | ICD-10-CM

## 2014-06-12 NOTE — Progress Notes (Signed)
   Subjective:    Patient ID: Joseph Johnston, male    DOB: [birth year], 59 y.o.   MRN: [id number]  HPI He is here for follow-up visit after recent treatment for syphilis. Presently he is having no symptoms of dysphagia he thought he might of seen in another chancre-like lesion several weeks ago. He's had no recent sexual activity.   Review of Systems     Objective:   Physical Exam Alert and in no distress.       Assessment & Plan:  Syphilis - Plan: RPR, CANCELED: RPR

## 2014-06-13 LAB — RPR: RPR Ser Ql: REACTIVE — AB

## 2014-06-13 LAB — RPR TITER

## 2014-06-13 LAB — FLUORESCENT TREPONEMAL AB(FTA)-IGG-BLD: Fluorescent Treponemal ABS: REACTIVE — AB

## 2014-06-27 ENCOUNTER — Encounter: Payer: Self-pay | Admitting: Family Medicine

## 2014-06-27 ENCOUNTER — Ambulatory Visit (INDEPENDENT_AMBULATORY_CARE_PROVIDER_SITE_OTHER): Payer: Federal, State, Local not specified - PPO | Admitting: Family Medicine

## 2014-06-27 VITALS — BP 132/90 | HR 80

## 2014-06-27 DIAGNOSIS — N342 Other urethritis: Secondary | ICD-10-CM

## 2014-06-27 MED ORDER — DOXYCYCLINE HYCLATE 100 MG PO TABS: 100.0000 mg | ORAL_TABLET | Freq: Two times a day (BID) | ORAL | Status: DC

## 2014-06-27 NOTE — Progress Notes (Signed)
   Subjective:    Patient ID: OBE AHLERS, male    DOB: 10/23/1955, 59 y.o.   MRN: [id number]  HPI He is again complaining of some dysuria but no discharge. He also has noted some redness and swelling to the head of the penis over this is now cleared up. There is been no recent sexual activity.   Review of Systems     Objective:   Physical Exam Exam of the penis shows a normal uncircumcised male. The head of the penis appears normal       Assessment & Plan:  Urethritis - Plan: doxycycline (VIBRA-TABS) 100 MG tablet, GC/chlamydia probe amp, urine I will check again the make sure it is not STD related and if negative, treat him for nonspecific urethritis.

## 2014-06-28 LAB — GC/CHLAMYDIA PROBE AMP, URINE
Chlamydia, Swab/Urine, PCR: NEGATIVE
GC Probe Amp, Urine: NEGATIVE

## 2014-06-29 ENCOUNTER — Ambulatory Visit (INDEPENDENT_AMBULATORY_CARE_PROVIDER_SITE_OTHER): Payer: Federal, State, Local not specified - PPO

## 2014-06-29 ENCOUNTER — Ambulatory Visit (INDEPENDENT_AMBULATORY_CARE_PROVIDER_SITE_OTHER): Payer: Federal, State, Local not specified - PPO | Admitting: Podiatry

## 2014-06-29 ENCOUNTER — Encounter: Payer: Self-pay | Admitting: Podiatry

## 2014-06-29 VITALS — BP 162/99 | HR 80 | Resp 12

## 2014-06-29 DIAGNOSIS — M722 Plantar fascial fibromatosis: Secondary | ICD-10-CM

## 2014-06-29 MED ORDER — MELOXICAM 15 MG PO TABS: 15.0000 mg | ORAL_TABLET | Freq: Every day | ORAL | Status: DC

## 2014-06-29 MED ORDER — TRIAMCINOLONE ACETONIDE 10 MG/ML IJ SUSP
10.0000 mg | Freq: Once | INTRAMUSCULAR | Status: AC
Start: 2014-06-29 — End: 2014-06-29
  Administered 2014-06-29: 10 mg

## 2014-06-29 NOTE — Progress Notes (Signed)
   Subjective:    Patient ID: Joseph Johnston, male    DOB: [birth year], 59 y.o.   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 THE ARCH IS GETTING WORSE ESPECIALLY AT NIGHT OR FIRST STEP IN THE MORNING. TRIED NO TREATMENT.  Review of Systems  All other systems reviewed and are negative.      Objective:   Physical Exam        Assessment & Plan:

## 2014-06-29 NOTE — Progress Notes (Signed)
Subjective:     Patient ID: Joseph Johnston, male   DOB: February 16, 1955, 59 y.o.   MRN: [id number]  HPI patient presents stating I'm having pain in my right arch that just gets me at different times and is very tender. I had on and off problems over the years but it really gotten worse over the last several months   Review of Systems  All other systems reviewed and are negative.      Objective:   Physical Exam  Constitutional: He is oriented to person, place, and time.  Cardiovascular: Intact distal pulses.   Musculoskeletal: Normal range of motion.  Neurological: He is oriented to person, place, and time.  Skin: Skin is warm.  Nursing note and vitals reviewed.  neurovascular status intact muscle strength adequate with range of motion subtalar midtarsal joint within normal limits. In the mid arch area right just lateral to the medial fascial band there is quite a bit of discomfort with inflammation and redness noted. No drainage no proximal erythema edema noted.     Assessment:     Acute plantar fasciitis mid arch region right    Plan:     H&P performed and injected the mid arch right 3 mg Kenalog 5 mg Xylocaine and applied night splint with instructions on usage and ice therapy with ice packs dispensed. Patient will be seen back again in 1 week and may require orthotics

## 2014-06-29 NOTE — Patient Instructions (Signed)

## 2014-07-06 ENCOUNTER — Ambulatory Visit (INDEPENDENT_AMBULATORY_CARE_PROVIDER_SITE_OTHER): Payer: Federal, State, Local not specified - PPO | Admitting: Podiatry

## 2014-07-06 ENCOUNTER — Encounter: Payer: Self-pay | Admitting: Podiatry

## 2014-07-06 VITALS — BP 141/89 | HR 78 | Resp 18

## 2014-07-06 DIAGNOSIS — M722 Plantar fascial fibromatosis: Secondary | ICD-10-CM

## 2014-07-06 DIAGNOSIS — L600 Ingrowing nail: Secondary | ICD-10-CM

## 2014-07-06 NOTE — Progress Notes (Signed)
Subjective:     Patient ID: Joseph Johnston, male   DOB: 1955-01-29, 59 y.o.   MRN: [id number]  HPI patient presents stating that his right heel is some improved and arch but that there is still an area that's very tender and he continues to use splint. Also complains about his right big toenail bothering him and that he cannot wear shoe gear comfortably   Review of Systems     Objective:   Physical Exam Neurovascular status intact muscle strength adequate with thick hallux nail right incurvated in the corners and pain in the distal arch right within the mid arch area with no swelling or fluid buildup around the area    Assessment:     Damaged hallux nail right with pain and trauma and inflamed right plantar fascia mid arch area    Plan:     Reviewed both conditions and at this time he wants the nail removed which will be done Monday when he has a few days off of work. Scanned for a Berkley type orthotic devices to reduce stress against his feet

## 2014-07-10 ENCOUNTER — Encounter: Payer: Self-pay | Admitting: Podiatry

## 2014-07-10 ENCOUNTER — Ambulatory Visit (INDEPENDENT_AMBULATORY_CARE_PROVIDER_SITE_OTHER): Payer: Federal, State, Local not specified - PPO | Admitting: Podiatry

## 2014-07-10 VITALS — BP 136/84 | HR 80 | Resp 12

## 2014-07-10 DIAGNOSIS — L600 Ingrowing nail: Secondary | ICD-10-CM | POA: Diagnosis not present

## 2014-07-10 DIAGNOSIS — M722 Plantar fascial fibromatosis: Secondary | ICD-10-CM

## 2014-07-10 MED ORDER — NEOMYCIN-POLYMYXIN-HC 3.5-10000-1 OT SOLN: OTIC | Status: DC

## 2014-07-10 NOTE — Patient Instructions (Signed)

## 2014-07-10 NOTE — Progress Notes (Signed)
Subjective:     Patient ID: Joseph Johnston, male   DOB: 12/21/1955, 59 y.o.   MRN: [id number]  HPI patient states my feet are feeling pretty good as far as the heels go but I know I need to have this big toenail remove right as it's been thick and painful in my left one did very well   Review of Systems     Objective:   Physical Exam Neurovascular status intact muscle strength adequate with damaged right hallux nailbed right that is sore when pressed and heels that are doing well bilateral    Assessment:     Damaged right hallux nail with pain and plantar fasciitis bilateral that's doing well    Plan:     Reviewed condition and H&P. I've recommended removal of this nail and explained the procedure and risk and patient wants surgery. Today I went ahead and I infiltrated the right hallux 60 mg like Marcaine mixture removed hallux nail exposed matrix and applied phenol 5 applications 30 seconds followed by alcohol lavage and sterile dressing. Gave instructions on soaks

## 2014-07-11 ENCOUNTER — Telehealth: Payer: Self-pay | Admitting: Podiatry

## 2014-07-13 ENCOUNTER — Telehealth: Payer: Self-pay | Admitting: *Deleted

## 2014-07-13 NOTE — Telephone Encounter (Signed)
Pt states he had a toenail procedure 07/10/2014 and now has blisters around the toenail area.  Dr. Prudence Davidson states stop antibacterial soap cleansing and Cortisporin Otic drops, use Epsom salt soaks and Bacitracin ointment.  Orders to pt, pt states understanding.

## 2014-07-20 ENCOUNTER — Other Ambulatory Visit: Payer: Self-pay | Admitting: Family Medicine

## 2014-07-21 ENCOUNTER — Other Ambulatory Visit: Payer: Self-pay | Admitting: Family Medicine

## 2014-07-21 DIAGNOSIS — N342 Other urethritis: Secondary | ICD-10-CM

## 2014-07-21 MED ORDER — DOXYCYCLINE HYCLATE 100 MG PO TABS: 100.0000 mg | ORAL_TABLET | Freq: Two times a day (BID) | ORAL | Status: DC

## 2014-09-22 ENCOUNTER — Ambulatory Visit (INDEPENDENT_AMBULATORY_CARE_PROVIDER_SITE_OTHER): Payer: Federal, State, Local not specified - PPO | Admitting: Family Medicine

## 2014-09-22 ENCOUNTER — Encounter: Payer: Self-pay | Admitting: Family Medicine

## 2014-09-22 VITALS — BP 120/80 | HR 109 | Wt 231.4 lb

## 2014-09-22 DIAGNOSIS — B20 Human immunodeficiency virus [HIV] disease: Secondary | ICD-10-CM | POA: Diagnosis not present

## 2014-09-22 DIAGNOSIS — A539 Syphilis, unspecified: Secondary | ICD-10-CM | POA: Diagnosis not present

## 2014-09-22 DIAGNOSIS — K640 First degree hemorrhoids: Secondary | ICD-10-CM

## 2014-09-22 DIAGNOSIS — N400 Enlarged prostate without lower urinary tract symptoms: Secondary | ICD-10-CM

## 2014-09-22 DIAGNOSIS — M1000 Idiopathic gout, unspecified site: Secondary | ICD-10-CM

## 2014-09-22 DIAGNOSIS — E785 Hyperlipidemia, unspecified: Secondary | ICD-10-CM

## 2014-09-22 DIAGNOSIS — I10 Essential (primary) hypertension: Secondary | ICD-10-CM | POA: Diagnosis not present

## 2014-09-22 DIAGNOSIS — N3943 Post-void dribbling: Secondary | ICD-10-CM | POA: Diagnosis not present

## 2014-09-22 DIAGNOSIS — Z23 Encounter for immunization: Secondary | ICD-10-CM | POA: Diagnosis not present

## 2014-09-22 LAB — CBC WITH DIFFERENTIAL/PLATELET
BASOS PCT: 0 % (ref 0–1)
Basophils Absolute: 0 10*3/uL (ref 0.0–0.1)
EOS ABS: 0.1 10*3/uL (ref 0.0–0.7)
Eosinophils Relative: 1 % (ref 0–5)
HCT: 42.9 % (ref 39.0–52.0)
Hemoglobin: 14.8 g/dL (ref 13.0–17.0)
Lymphocytes Relative: 40 % (ref 12–46)
Lymphs Abs: 3 10*3/uL (ref 0.7–4.0)
MCH: 32.7 pg (ref 26.0–34.0)
MCHC: 34.5 g/dL (ref 30.0–36.0)
MCV: 94.7 fL (ref 78.0–100.0)
MPV: 9.1 fL (ref 8.6–12.4)
Monocytes Absolute: 0.4 10*3/uL (ref 0.1–1.0)
Monocytes Relative: 5 % (ref 3–12)
NEUTROS ABS: 4.1 10*3/uL (ref 1.7–7.7)
NEUTROS PCT: 54 % (ref 43–77)
PLATELETS: 326 10*3/uL (ref 150–400)
RBC: 4.53 MIL/uL (ref 4.22–5.81)
RDW: 13.2 % (ref 11.5–15.5)
WBC: 7.5 10*3/uL (ref 4.0–10.5)

## 2014-09-22 LAB — COMPREHENSIVE METABOLIC PANEL
ALT: 36 U/L (ref 9–46)
AST: 35 U/L (ref 10–35)
Albumin: 4.9 g/dL (ref 3.6–5.1)
Alkaline Phosphatase: 73 U/L (ref 40–115)
BILIRUBIN TOTAL: 0.7 mg/dL (ref 0.2–1.2)
BUN: 18 mg/dL (ref 7–25)
CO2: 23 mmol/L (ref 20–31)
CREATININE: 1.21 mg/dL (ref 0.70–1.33)
Calcium: 9.5 mg/dL (ref 8.6–10.3)
Chloride: 106 mmol/L (ref 98–110)
Glucose, Bld: 94 mg/dL (ref 65–99)
Potassium: 3.8 mmol/L (ref 3.5–5.3)
SODIUM: 142 mmol/L (ref 135–146)
Total Protein: 7.4 g/dL (ref 6.1–8.1)

## 2014-09-22 LAB — LIPID PANEL
Cholesterol: 212 mg/dL — ABNORMAL HIGH (ref 125–200)
HDL: 32 mg/dL — ABNORMAL LOW (ref >=40)
LDL CALC: 132 mg/dL — AB (ref <130)
TRIGLYCERIDES: 239 mg/dL — AB (ref <150)
Total CHOL/HDL Ratio: 6.6 Ratio — ABNORMAL HIGH (ref <=5.0)
VLDL: 48 mg/dL — ABNORMAL HIGH (ref <30)

## 2014-09-22 LAB — URIC ACID: Uric Acid, Serum: 8.2 mg/dL — ABNORMAL HIGH (ref 4.0–7.8)

## 2014-09-22 LAB — RPR: RPR: REACTIVE — AB

## 2014-09-22 LAB — RPR TITER: RPR Titer: 1:32 {titer} — AB

## 2014-09-22 NOTE — Progress Notes (Signed)
   Subjective:    Patient ID: Joseph Johnston, male    DOB: 02/07/1955, 59 y.o.   MRN: [id number]  HPI He is here for an interval evaluation. He has not been seen in several months. He continues on his HIV medications and is having no difficulty with them. No fever, chills, weight change. He has had difficulty with a hemorrhoid however it has gotten much smaller. He would like me to evaluate that further. He does have a history of hyperlipidemia. He also has a history of gout but is having no difficulty with that. He does complain of continued difficulty with postvoiding dribbling. He also states that he gets up sometimes 45 times per night to urinate. He does have a history of BPH.He is still living here in [place] but apparently he and his partner will doing extensive traveling.He also has a history of syphilis and needs follow-up blood work concerning this. He was treated with antibody several months ago.  Review of Systems     Objective:   Physical Exam Alert and in no distress. Tympanic membranes and canals are normal. Pharyngeal area is normal. Neck is supple without adenopathy or thyromegaly. Cardiac exam shows a regular sinus rhythm without murmurs or gallops. Lungs are clear to auscultation. Rectal exam does show a small question of a thrombosed hemorrhoid present at 9:00.       Assessment & Plan:  Human immunodeficiency virus (HIV) disease - Plan: CBC with Differential/Platelet, Comprehensive metabolic panel, Lipid panel, T-helper cells (CD4) count, HIV 1 RNA quant-no reflex-bld  Hyperlipidemia with target LDL less than 100 - Plan: Lipid panel  Essential hypertension - Plan: CBC with Differential/Platelet, Comprehensive metabolic panel  Post-void dribbling - Plan: Ambulatory referral to Urology  Idiopathic gout, unspecified chronicity, unspecified site - Plan: Uric Acid  Need for prophylactic vaccination and inoculation against influenza - Plan: Flu Vaccine QUAD 36+ mos  IM  Syphilis - Plan: RPR  BPH (benign prostatic hyperplasia)  First degree hemorrhoids No therapy for hemorrhoid Except for heat and ensure softer bowel movements as it does seem to be getting smaller.

## 2014-09-22 NOTE — Patient Instructions (Signed)
Heat for 20 minutes 3 times per day and softer BMs.

## 2014-09-25 LAB — T-HELPER CELLS (CD4) COUNT (NOT AT ARMC)
Absolute CD4: 1410 /uL (ref 381–1469)
CD4 T Helper %: 47 % (ref 32–62)
TOTAL LYMPHOCYTE COUNT: 3000 /uL (ref 700–3300)
Total Lymphocyte: 40 % (ref 12–46)
WBC, lymph enumeration: 7.5 10*3/uL (ref 4.0–10.5)

## 2014-09-25 LAB — FLUORESCENT TREPONEMAL AB(FTA)-IGG-BLD: FLUORESCENT TREPONEMAL ABS: REACTIVE — AB

## 2014-09-26 LAB — HIV-1 RNA QUANT-NO REFLEX-BLD
HIV 1 RNA Quant: 23 copies/mL — ABNORMAL HIGH (ref <20)
HIV-1 RNA QUANT, LOG: 1.36 {Log} — AB (ref <1.30)

## 2014-09-26 MED ORDER — ALLOPURINOL 100 MG PO TABS: ORAL_TABLET | ORAL | Status: DC

## 2014-09-26 NOTE — Addendum Note (Signed)
Addended by: Denita Lung on: 09/26/2014 12:39 PM   Modules accepted: Orders

## 2014-10-10 ENCOUNTER — Other Ambulatory Visit: Payer: Self-pay | Admitting: Family Medicine

## 2014-10-19 ENCOUNTER — Encounter: Payer: Self-pay | Admitting: Family Medicine

## 2014-10-19 ENCOUNTER — Ambulatory Visit (INDEPENDENT_AMBULATORY_CARE_PROVIDER_SITE_OTHER): Payer: Federal, State, Local not specified - PPO | Admitting: Family Medicine

## 2014-10-19 VITALS — BP 112/80 | HR 100 | Wt 240.0 lb

## 2014-10-19 DIAGNOSIS — Z209 Contact with and (suspected) exposure to unspecified communicable disease: Secondary | ICD-10-CM

## 2014-10-19 MED ORDER — CEFTRIAXONE SODIUM 250 MG IJ SOLR
250.0000 mg | Freq: Once | INTRAMUSCULAR | Status: AC
  Administered 2014-10-19: 250 mg via INTRAMUSCULAR

## 2014-10-19 NOTE — Progress Notes (Signed)
   Subjective:    Patient ID: Joseph Johnston, male    DOB: [birth year], 59 y.o.   MRN: [id number]  HPI He is here for consult concerning recent exposure to gonorrhea. He received an email stating that he has been exposed. He has had no discharge, dysuria or urgency.   Review of Systems     Objective:   Physical Exam Alert and in no distress otherwise not examined       Assessment & Plan:  Contact with or exposure to communicable disease - Plan: GC/chlamydia probe amp, urine I will do the appropriate testing but I will also go ahead and give him ceftriaxone. He is comfortable with this and would prefer it.

## 2014-10-20 LAB — GC/CHLAMYDIA PROBE AMP, URINE
Chlamydia, Swab/Urine, PCR: NEGATIVE
GC Probe Amp, Urine: NEGATIVE

## 2014-10-31 ENCOUNTER — Ambulatory Visit: Payer: Federal, State, Local not specified - PPO | Admitting: *Deleted

## 2014-10-31 DIAGNOSIS — M722 Plantar fascial fibromatosis: Secondary | ICD-10-CM

## 2014-10-31 NOTE — Progress Notes (Signed)
Patient ID: Joseph Johnston, male   DOB: 1955-10-01, 59 y.o.   MRN: [id number] Patient presents for orthotic pick up.  Verbal and written break in and wear instructions given.  Patient will follow up in 4 weeks if symptoms worsen or fail to improve.

## 2014-10-31 NOTE — Patient Instructions (Signed)

## 2014-11-13 ENCOUNTER — Telehealth: Payer: Self-pay | Admitting: Family Medicine

## 2014-11-13 MED ORDER — ALLOPURINOL 300 MG PO TABS: 300.0000 mg | ORAL_TABLET | Freq: Every day | ORAL | Status: DC

## 2014-11-13 NOTE — Telephone Encounter (Signed)
Pt states at his office visit in Aug he received a my chart message stating his allopurinol was going to be doubled. When he sent in refill it was still for the old dose. Please send in correct dose to [facility] on [address].

## 2014-11-15 ENCOUNTER — Encounter: Payer: Self-pay | Admitting: Podiatry

## 2014-11-15 ENCOUNTER — Ambulatory Visit (INDEPENDENT_AMBULATORY_CARE_PROVIDER_SITE_OTHER): Payer: Federal, State, Local not specified - PPO | Admitting: Podiatry

## 2014-11-15 VITALS — BP 66/45 | HR 83 | Resp 16

## 2014-11-15 DIAGNOSIS — M779 Enthesopathy, unspecified: Secondary | ICD-10-CM | POA: Diagnosis not present

## 2014-11-15 MED ORDER — TRIAMCINOLONE ACETONIDE 10 MG/ML IJ SUSP
10.0000 mg | Freq: Once | INTRAMUSCULAR | Status: AC
Start: 2014-11-15 — End: 2014-11-15
  Administered 2014-11-15: 10 mg

## 2014-11-15 NOTE — Progress Notes (Signed)
Subjective:     Patient ID: Joseph Johnston, male   DOB: [birth year], 59 y.o.   MRN: [id number]  HPI patient states I'm getting pain in the outside of my right foot and the arch is feeling okay but at times I still gets sharp pains and the orthotics seem to be doing well but I'm not sure there part of my problems   Review of Systems     Objective:   Physical Exam Neurovascular status intact muscle strength adequate with discomfort in the lateral aspect of the peroneal insertion right with mild discomfort in the distal portion of the plantar fascia right    Assessment:     Peroneal tendinitis right with inflammation with distal fascial pain right    Plan:     H&P condition reviewed and careful peroneal injection administered 3 mg Kenalog 5 mg Xylocaine and instructed on ice therapy. We will give this 4 weeks and reevaluate how he is doing

## 2014-11-29 ENCOUNTER — Ambulatory Visit: Payer: Federal, State, Local not specified - PPO | Admitting: Family Medicine

## 2014-12-13 ENCOUNTER — Ambulatory Visit: Payer: Federal, State, Local not specified - PPO | Admitting: Podiatry

## 2014-12-18 ENCOUNTER — Ambulatory Visit (INDEPENDENT_AMBULATORY_CARE_PROVIDER_SITE_OTHER): Payer: Federal, State, Local not specified - PPO | Admitting: Family Medicine

## 2014-12-18 ENCOUNTER — Encounter: Payer: Self-pay | Admitting: Family Medicine

## 2014-12-18 VITALS — BP 120/80 | HR 100 | Temp 100.1°F | Ht 69.0 in | Wt 238.0 lb

## 2014-12-18 DIAGNOSIS — Z6379 Other stressful life events affecting family and household: Secondary | ICD-10-CM | POA: Diagnosis not present

## 2014-12-18 DIAGNOSIS — J0100 Acute maxillary sinusitis, unspecified: Secondary | ICD-10-CM | POA: Diagnosis not present

## 2014-12-18 MED ORDER — AMOXICILLIN 875 MG PO TABS
875.0000 mg | ORAL_TABLET | Freq: Two times a day (BID) | ORAL | Status: DC
Start: 2014-12-18 — End: 2014-12-26

## 2014-12-18 NOTE — Progress Notes (Signed)
   Subjective:    Patient ID: Joseph Johnston, male    DOB: May 04, 1955, 59 y.o.   MRN: [id number]  HPI He complains of a one-week history that started with a dry cough dressing to a productive cough with nasal congestion, headache, right earache, fever and chills. No sore throat. He does not smoke. At the end of the encounter he then mentioned the fact that his 11 year old mother apparently has a lung lesion found on CT.   Review of Systems     Objective:   Physical Exam Alert and in no distress. Tympanic membranes and canals are normal. Pharyngeal area is normal. Neck is supple without adenopathy or thyromegaly. Cardiac exam shows a regular sinus rhythm without murmurs or gallops. Lungs are clear to auscultation. Nasal mucosa is red with tenderness especially over right maxillary sinus       Assessment & Plan:  Acute maxillary sinusitis, recurrence not specified - Plan: amoxicillin (AMOXIL) 875 MG tablet  Stress due to illness of family member  instructed to take all the antibiotic and call if not entirely better. Within discussed the issue with his mother. He seems to have a good handle on this in regard to being practical about this. Apparently the family has been through lung cancer treatment in the past. He will try to guide her to do what she feels is best for her.

## 2014-12-18 NOTE — Patient Instructions (Signed)
Take all the antibiotic and if not totally back to normal when you finish call me °

## 2014-12-20 NOTE — Telephone Encounter (Signed)
error 

## 2014-12-26 ENCOUNTER — Telehealth: Payer: Self-pay | Admitting: Family Medicine

## 2014-12-26 DIAGNOSIS — J0100 Acute maxillary sinusitis, unspecified: Secondary | ICD-10-CM

## 2014-12-26 MED ORDER — AMOXICILLIN 875 MG PO TABS: 875.0000 mg | ORAL_TABLET | Freq: Two times a day (BID) | ORAL | Status: DC

## 2014-12-26 NOTE — Telephone Encounter (Signed)
Left message on # med sent in

## 2014-12-26 NOTE — Telephone Encounter (Signed)
Pt said that sinuses is better but now chest is very congested and only has 1 day of antibiotics left

## 2014-12-26 NOTE — Telephone Encounter (Signed)
Let him know that I called in another prescription in

## 2015-02-19 ENCOUNTER — Encounter: Payer: Self-pay | Admitting: Family Medicine

## 2015-02-19 ENCOUNTER — Ambulatory Visit (INDEPENDENT_AMBULATORY_CARE_PROVIDER_SITE_OTHER): Payer: Federal, State, Local not specified - PPO | Admitting: Family Medicine

## 2015-02-19 VITALS — BP 130/90 | HR 94 | Ht 70.0 in | Wt 246.2 lb

## 2015-02-19 DIAGNOSIS — H109 Unspecified conjunctivitis: Secondary | ICD-10-CM | POA: Diagnosis not present

## 2015-02-19 MED ORDER — ERYTHROMYCIN 5 MG/GM OP OINT: 1.0000 "application " | TOPICAL_OINTMENT | Freq: Every day | OPHTHALMIC | Status: DC

## 2015-02-19 NOTE — Progress Notes (Signed)
   Subjective:    Patient ID: Joseph Johnston, male    DOB: 12-24-1955, 60 y.o.   MRN: [id number]  HPI He complains of a one-day history of redness and purulent drainage from the right eye. No fever, chills, cough or congestion.   Review of Systems     Objective:   Physical Exam Right conjunctiva is injected with a small amount of purulent exudate noted. No preauricular nodes. TMs clear. Throat clear. Neck supple without adenopathy.       Assessment & Plan:  Conjunctivitis of right eye - Plan: erythromycin [facility]) ophthalmic ointment He will call if further trouble.

## 2015-03-26 ENCOUNTER — Encounter: Payer: Self-pay | Admitting: Family Medicine

## 2015-03-26 ENCOUNTER — Other Ambulatory Visit: Payer: Self-pay | Admitting: Family Medicine

## 2015-03-26 ENCOUNTER — Ambulatory Visit (INDEPENDENT_AMBULATORY_CARE_PROVIDER_SITE_OTHER): Payer: Federal, State, Local not specified - PPO | Admitting: Family Medicine

## 2015-03-26 VITALS — BP 130/88 | HR 81 | Wt 244.0 lb

## 2015-03-26 DIAGNOSIS — Z8249 Family history of ischemic heart disease and other diseases of the circulatory system: Secondary | ICD-10-CM

## 2015-03-26 DIAGNOSIS — M1000 Idiopathic gout, unspecified site: Secondary | ICD-10-CM | POA: Diagnosis not present

## 2015-03-26 DIAGNOSIS — B20 Human immunodeficiency virus [HIV] disease: Secondary | ICD-10-CM

## 2015-03-26 DIAGNOSIS — N400 Enlarged prostate without lower urinary tract symptoms: Secondary | ICD-10-CM | POA: Diagnosis not present

## 2015-03-26 DIAGNOSIS — I10 Essential (primary) hypertension: Secondary | ICD-10-CM | POA: Diagnosis not present

## 2015-03-26 DIAGNOSIS — E785 Hyperlipidemia, unspecified: Secondary | ICD-10-CM

## 2015-03-26 DIAGNOSIS — G2581 Restless legs syndrome: Secondary | ICD-10-CM

## 2015-03-26 DIAGNOSIS — G4730 Sleep apnea, unspecified: Secondary | ICD-10-CM

## 2015-03-26 DIAGNOSIS — Z1159 Encounter for screening for other viral diseases: Secondary | ICD-10-CM | POA: Diagnosis not present

## 2015-03-26 DIAGNOSIS — I200 Unstable angina: Secondary | ICD-10-CM | POA: Diagnosis not present

## 2015-03-26 DIAGNOSIS — G44009 Cluster headache syndrome, unspecified, not intractable: Secondary | ICD-10-CM

## 2015-03-26 LAB — LIPID PANEL
Cholesterol: 205 mg/dL — ABNORMAL HIGH (ref 125–200)
HDL: 29 mg/dL — AB (ref >=40)
LDL CALC: 132 mg/dL — AB (ref <130)
Total CHOL/HDL Ratio: 7.1 Ratio — ABNORMAL HIGH (ref <=5.0)
Triglycerides: 221 mg/dL — ABNORMAL HIGH (ref <150)
VLDL: 44 mg/dL — ABNORMAL HIGH (ref <30)

## 2015-03-26 LAB — CBC WITH DIFFERENTIAL/PLATELET
Basophils Absolute: 0 10*3/uL (ref 0.0–0.1)
Basophils Relative: 0 % (ref 0–1)
EOS PCT: 0 % (ref 0–5)
Eosinophils Absolute: 0 10*3/uL (ref 0.0–0.7)
HCT: 45.2 % (ref 39.0–52.0)
Hemoglobin: 16.1 g/dL (ref 13.0–17.0)
LYMPHS ABS: 2.9 10*3/uL (ref 0.7–4.0)
LYMPHS PCT: 43 % (ref 12–46)
MCH: 33.6 pg (ref 26.0–34.0)
MCHC: 35.6 g/dL (ref 30.0–36.0)
MCV: 94.4 fL (ref 78.0–100.0)
MONO ABS: 0.3 10*3/uL (ref 0.1–1.0)
MPV: 9 fL (ref 8.6–12.4)
Monocytes Relative: 5 % (ref 3–12)
NEUTROS ABS: 3.5 10*3/uL (ref 1.7–7.7)
Neutrophils Relative %: 52 % (ref 43–77)
Platelets: 309 10*3/uL (ref 150–400)
RBC: 4.79 MIL/uL (ref 4.22–5.81)
RDW: 13.4 % (ref 11.5–15.5)
WBC: 6.7 10*3/uL (ref 4.0–10.5)

## 2015-03-26 LAB — COMPREHENSIVE METABOLIC PANEL
ALBUMIN: 4.7 g/dL (ref 3.6–5.1)
ALK PHOS: 80 U/L (ref 40–115)
ALT: 45 U/L (ref 9–46)
AST: 34 U/L (ref 10–35)
BILIRUBIN TOTAL: 0.7 mg/dL (ref 0.2–1.2)
BUN: 10 mg/dL (ref 7–25)
CALCIUM: 10.1 mg/dL (ref 8.6–10.3)
CHLORIDE: 103 mmol/L (ref 98–110)
CO2: 24 mmol/L (ref 20–31)
Creat: 0.99 mg/dL (ref 0.70–1.33)
GLUCOSE: 94 mg/dL (ref 65–99)
Potassium: 4.3 mmol/L (ref 3.5–5.3)
SODIUM: 139 mmol/L (ref 135–146)
Total Protein: 7.4 g/dL (ref 6.1–8.1)

## 2015-03-26 LAB — HEPATITIS C ANTIBODY: HCV AB: NEGATIVE

## 2015-03-26 NOTE — Progress Notes (Signed)
   Subjective:    Patient ID: Joseph Johnston, male    DOB: [birth year], 60 y.o.   MRN: [id number]  HPI He is here for an interval evaluation. He has not had any blood work done in quite some time. He has had no difficulty with chest pain, shortness of breath, weight change, headache, fever or chills. He does have underlying gout and continues on medications for this. He also continues on his HIV meds again without any difficulty. He does have a remote history of OSA from 2005. He also had some difficulty with RLS but presently is not having either of these treated. He also has a previous history of cluster headaches again not any symptoms recently. As mentioned above no cardiac symptoms.he apparently also has a previous history of diverticulitis but none recently. Neurologically he seems to be very stable.  Review of Systems     Objective:   Physical Exam Alert and in no distress. Tympanic membranes and canals are normal. Pharyngeal area is normal. Neck is supple without adenopathy or thyromegaly. Cardiac exam shows a regular sinus rhythm without murmurs or gallops. Lungs are clear to auscultation.abdominal exam shows no masses or tenderness with normal bowel sounds        Assessment & Plan:  BPH (benign prostatic hyperplasia)  Idiopathic gout, unspecified chronicity, unspecified site - Plan: Uric Acid  Human immunodeficiency virus (HIV) disease ([place]) - Plan: CBC with Differential/Platelet, Comprehensive metabolic panel, Lipid panel, HIV 1 RNA quant-no reflex-bld, T-helper cells (CD4) count (not at [facility])  Hyperlipidemia with target LDL less than 100 - Plan: Lipid panel  Essential hypertension - Plan: CBC with Differential/Platelet, Comprehensive metabolic panel  Family history of heart disease in male family member before age 7 - Plan: Hepatitis C antibody  Cluster headache, not intractable, unspecified chronicity pattern  RLS (restless legs syndrome)  Intermediate coronary  syndrome (HCC)  Need for hepatitis C screening test - Plan: Hepatitis C antibody over 45 minutes spent discussing all these issues with him greater than 50% spent in counseling and coordination of care

## 2015-03-27 LAB — HIV-1 RNA QUANT-NO REFLEX-BLD: HIV-1 RNA Quant, Log: <1.3 Log copies/mL (ref <1.30)

## 2015-03-27 LAB — T-HELPER CELLS (CD4) COUNT (NOT AT ARMC)
Absolute CD4: 1418 cells/uL (ref 381–1469)
CD4 T HELPER %: 44 % (ref 32–62)
TOTAL LYMPHOCYTE COUNT: 3200 {cells}/uL (ref 700–3300)

## 2015-03-27 LAB — URIC ACID: URIC ACID, SERUM: 5 mg/dL (ref 4.0–7.8)

## 2015-03-28 ENCOUNTER — Telehealth: Payer: Self-pay | Admitting: Family Medicine

## 2015-03-28 MED ORDER — ALFUZOSIN HCL ER 10 MG PO TB24: 10.0000 mg | ORAL_TABLET | Freq: Every day | ORAL | Status: DC

## 2015-03-28 MED ORDER — ALLOPURINOL 300 MG PO TABS: 300.0000 mg | ORAL_TABLET | Freq: Every day | ORAL | Status: DC

## 2015-03-28 MED ORDER — DOLUTEGRAVIR SODIUM 50 MG PO TABS: 50.0000 mg | ORAL_TABLET | Freq: Every day | ORAL | Status: DC

## 2015-03-28 MED ORDER — EMTRICITABINE-TENOFOVIR DF 200-300 MG PO TABS: ORAL_TABLET | ORAL | Status: DC

## 2015-03-28 NOTE — Telephone Encounter (Signed)
Pt called and wants antibiotic for his sore throat.  He said you looked at his throat on Monday.  Please call to [facility] on [address].  Pt ph [phone]

## 2015-03-28 NOTE — Telephone Encounter (Signed)
No need for an antibiotic right now

## 2015-03-28 NOTE — Addendum Note (Signed)
Addended by: Denita Lung on: 03/28/2015 12:16 PM   Modules accepted: Orders

## 2015-03-29 ENCOUNTER — Encounter: Payer: Self-pay | Admitting: Family Medicine

## 2015-03-29 ENCOUNTER — Ambulatory Visit (INDEPENDENT_AMBULATORY_CARE_PROVIDER_SITE_OTHER): Payer: Federal, State, Local not specified - PPO | Admitting: Family Medicine

## 2015-03-29 VITALS — BP 128/80 | HR 68 | Temp 98.3°F | Wt 247.0 lb

## 2015-03-29 DIAGNOSIS — J029 Acute pharyngitis, unspecified: Secondary | ICD-10-CM

## 2015-03-29 DIAGNOSIS — J069 Acute upper respiratory infection, unspecified: Secondary | ICD-10-CM

## 2015-03-29 LAB — POCT RAPID STREP A (OFFICE): RAPID STREP A SCREEN: NEGATIVE

## 2015-03-29 NOTE — Telephone Encounter (Signed)
Pt was notified at today's visit but having more symtpoms

## 2015-03-29 NOTE — Progress Notes (Signed)
   Subjective:    Patient ID: Joseph Johnston, male    DOB: 1955-10-08, 60 y.o.   MRN: [id number]  HPI He complains of a three-day history of sore throat, chest congestion, cough, chills but no earache, myalgias or arthralgias, fatigue.   Review of Systems     Objective:   Physical Exam Alert and in no distress. Tympanic membranes and canals are normal. Pharyngeal area is normal. Neck is supple without adenopathy or thyromegaly. Cardiac exam shows a regular sinus rhythm without murmurs or gallops. Lungs are clear to auscultation. Strep screen is negative       Assessment & Plan:  Sore throat - Plan: POCT rapid strep A  Acute URI recommend supportive care and if symptoms persist past 7-10 days, he will call me.

## 2015-03-29 NOTE — Patient Instructions (Signed)
Treat your symptoms. Call me next week if not any better

## 2015-05-20 IMAGING — CR DG CHEST 2V
2 series · 2 of 2 positions shown · non-contrast
Comparison: 08/25/2005.

CLINICAL DATA: Chest pain .

EXAM:
CHEST  2 VIEW

[w chest pa]
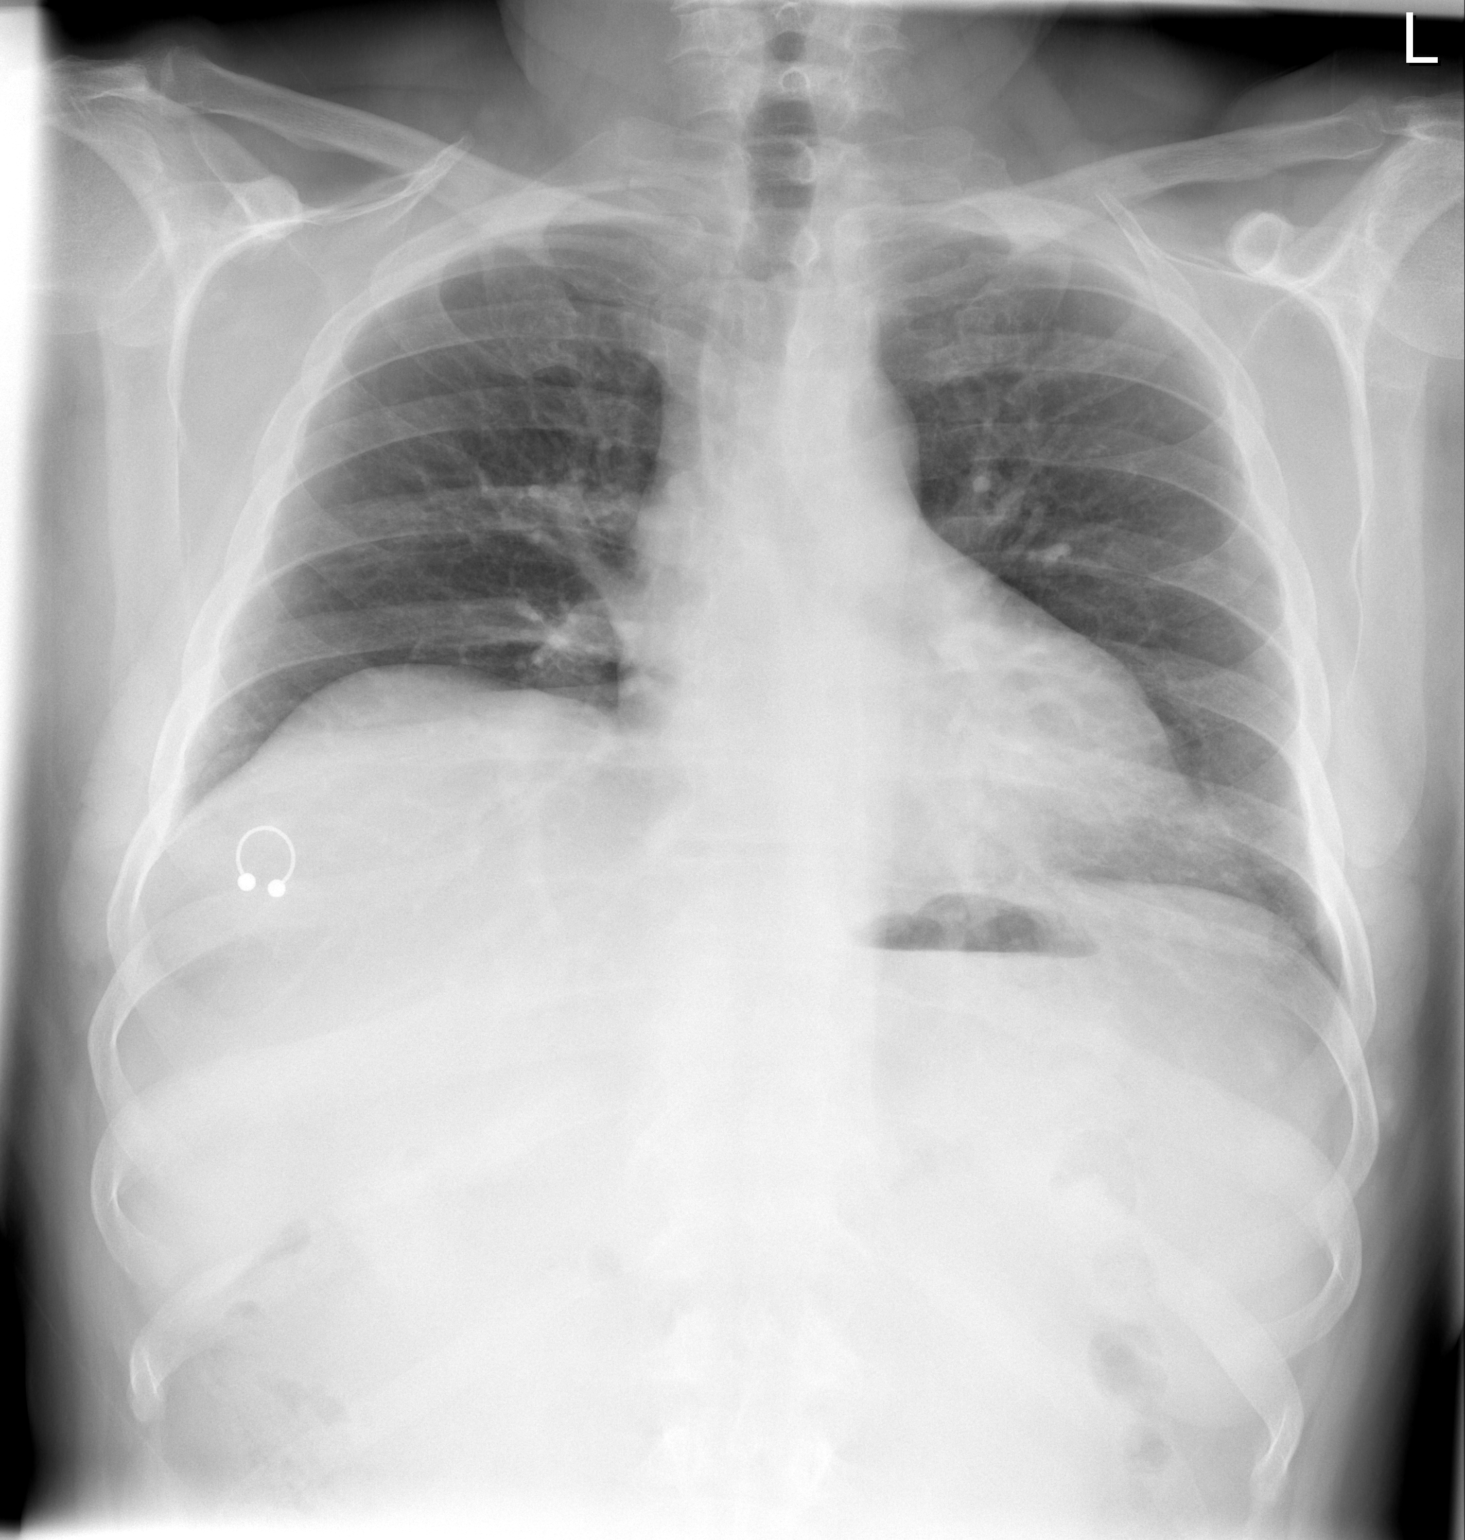

[w chest lat]
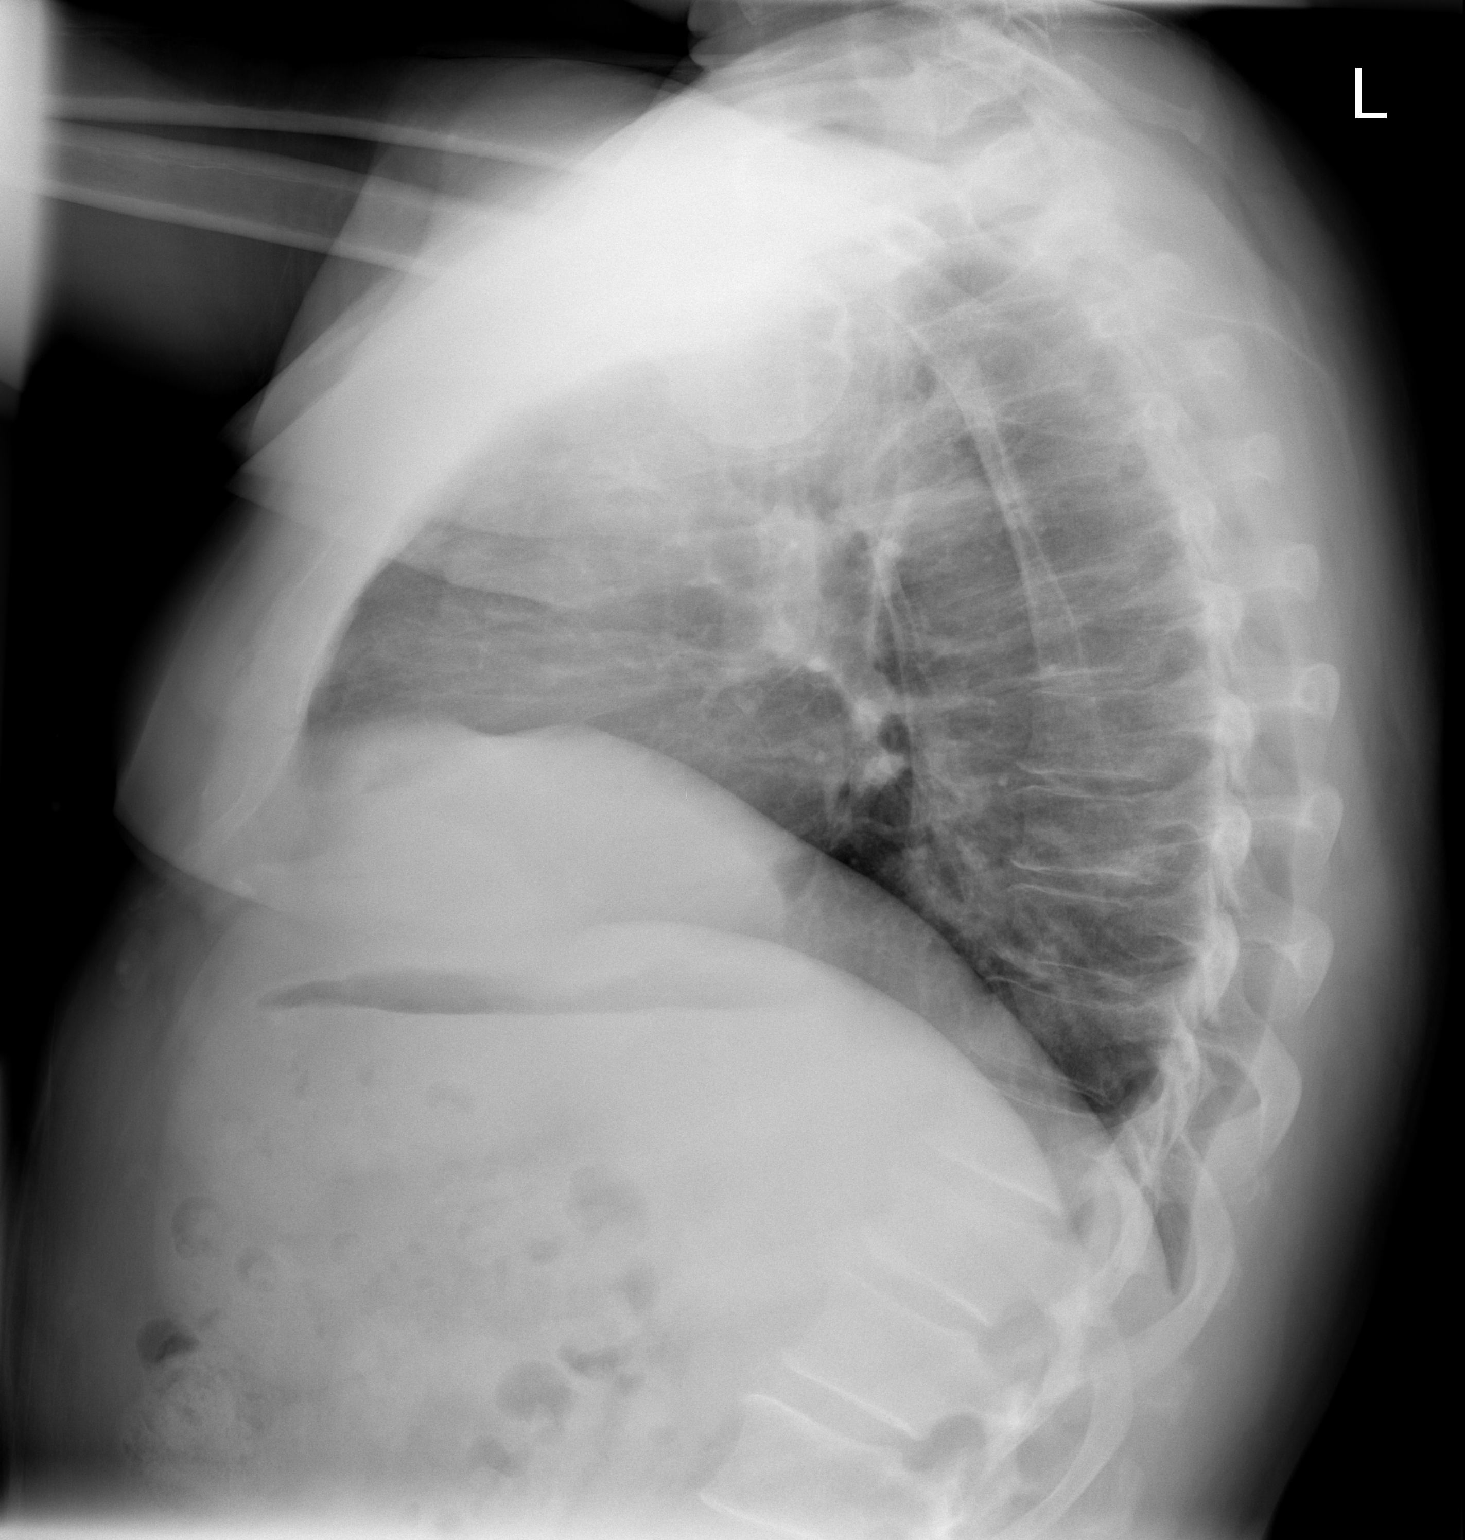

[2 of 2 positions shown; findings below may reference images not displayed]

FINDINGS: Mediastinum and hilar structures normal. Lungs are clear. Heart size
normal. No acute bony abnormality identified.
IMPRESSION: No active cardiopulmonary disease.

## 2015-06-01 ENCOUNTER — Telehealth: Payer: Self-pay | Admitting: Family Medicine

## 2015-06-01 NOTE — Telephone Encounter (Signed)
Received signed request to send records to [facility]. Records faxed to [phone]

## 2015-08-29 ENCOUNTER — Other Ambulatory Visit: Payer: Self-pay | Admitting: Family Medicine

## 2015-08-29 NOTE — Telephone Encounter (Signed)
Is this okay to refill? 

## 2015-08-29 NOTE — Telephone Encounter (Signed)
Needs a office visit

## 2015-09-14 ENCOUNTER — Other Ambulatory Visit: Payer: Self-pay | Admitting: Medical

## 2015-09-14 NOTE — Telephone Encounter (Signed)
Is this ok to refill?  

## 2015-09-24 ENCOUNTER — Ambulatory Visit (INDEPENDENT_AMBULATORY_CARE_PROVIDER_SITE_OTHER): Payer: Federal, State, Local not specified - PPO | Admitting: Family Medicine

## 2015-09-24 ENCOUNTER — Encounter: Payer: Self-pay | Admitting: Family Medicine

## 2015-09-24 VITALS — BP 126/80 | HR 94 | Wt 240.0 lb

## 2015-09-24 DIAGNOSIS — M7989 Other specified soft tissue disorders: Secondary | ICD-10-CM

## 2015-09-24 DIAGNOSIS — Z23 Encounter for immunization: Secondary | ICD-10-CM

## 2015-09-24 DIAGNOSIS — N138 Other obstructive and reflux uropathy: Secondary | ICD-10-CM

## 2015-09-24 DIAGNOSIS — R208 Other disturbances of skin sensation: Secondary | ICD-10-CM | POA: Diagnosis not present

## 2015-09-24 DIAGNOSIS — R339 Retention of urine, unspecified: Secondary | ICD-10-CM | POA: Diagnosis not present

## 2015-09-24 DIAGNOSIS — N401 Enlarged prostate with lower urinary tract symptoms: Secondary | ICD-10-CM

## 2015-09-24 DIAGNOSIS — L259 Unspecified contact dermatitis, unspecified cause: Secondary | ICD-10-CM

## 2015-09-24 DIAGNOSIS — Q825 Congenital non-neoplastic nevus: Secondary | ICD-10-CM

## 2015-09-24 LAB — POCT URINALYSIS DIPSTICK
Bilirubin, UA: NEGATIVE
Glucose, UA: NEGATIVE
KETONES UA: NEGATIVE
Leukocytes, UA: NEGATIVE
Nitrite, UA: NEGATIVE
PROTEIN UA: NEGATIVE
RBC UA: NEGATIVE
SPEC GRAV UA: 1.015
UROBILINOGEN UA: NEGATIVE
pH, UA: 6

## 2015-09-24 MED ORDER — FINASTERIDE 5 MG PO TABS: 5.0000 mg | ORAL_TABLET | Freq: Every day | ORAL | 3 refills | Status: DC

## 2015-09-24 NOTE — Progress Notes (Signed)
   Subjective:    Patient ID: Joseph Johnston, male    DOB: 1956/01/08, 60 y.o.   MRN: [id number]  HPI He is here for a consult concerning multiple issues. He has noted the relatively cloudy urine but no dysuria, frequency. He does have a history of BPH and presently is on Uroxatral. He does complain of hesitancy as well as decreased stream and incomplete emptying. He has had no recent sexual activity. He also complains of a 3 month history of intermittent right lower extremity swelling but no history of prolonged sitting, injury, chest pain, shortness of breath. The symptoms last for several days and then go away. He also complains of decreased sensation to the toes on the right except the great toe. He states that this started after he had an injection for plantar fasciitis. He also states that he has noticed some slight change in the birthmark on the right flank. He also has a linear pustular lesion on his left arm of approximately 1/2 cm.   Review of Systems     Objective:   Physical Exam Alert and in no distress. Urine dipstick was negative. Exam of the right leg shows the need to be normal with motion no effusion noted. Negative anterior drawer and McMurray's testing. Homans sign is negative. Pulses and reflexes are normal. He does state he has decreased sensation to the second through fifth toes with normal motor function. Pigmented linear birthmark noted on the right flank.      Assessment & Plan:  Need for prophylactic vaccination and inoculation against influenza - Plan: Flu Vaccine QUAD 36+ mos PF IM (Fluarix & Fluzone Quad PF)  Enlarged prostate with urinary obstruction - Plan: finasteride (PROSCAR) 5 MG tablet, POCT Urinalysis Dipstick  Hyperplasia of prostate with urinary obstruction - Plan: finasteride (PROSCAR) 5 MG tablet  Incomplete bladder emptying - Plan: POCT Urinalysis Dipstick  Contact dermatitis  Birthmark of skin  Right leg swelling  Decreased sensation of  foot I will give him Proscar see if this will help with his urinary symptoms. He will keep me informed concerning this. He is to keep an eye on the birthmark to see if it does indeed change and if so we might need to biopsy him. Recommend he return here for follow-up when he has a leg swelling for more definitive evaluation. Discussed the decreased sensation in this could potentially be a peripheral neuropathy however he has no reason to have that. No therapy for the contact dermatitis. No therapy for the contact dermatitis.

## 2015-10-31 ENCOUNTER — Ambulatory Visit (INDEPENDENT_AMBULATORY_CARE_PROVIDER_SITE_OTHER): Payer: Federal, State, Local not specified - PPO

## 2015-10-31 ENCOUNTER — Encounter: Payer: Self-pay | Admitting: Podiatry

## 2015-10-31 ENCOUNTER — Ambulatory Visit (INDEPENDENT_AMBULATORY_CARE_PROVIDER_SITE_OTHER): Payer: Federal, State, Local not specified - PPO | Admitting: Podiatry

## 2015-10-31 VITALS — BP 147/95 | HR 84 | Resp 16

## 2015-10-31 DIAGNOSIS — M79671 Pain in right foot: Secondary | ICD-10-CM

## 2015-10-31 DIAGNOSIS — M79672 Pain in left foot: Secondary | ICD-10-CM

## 2015-10-31 DIAGNOSIS — G629 Polyneuropathy, unspecified: Secondary | ICD-10-CM

## 2015-10-31 NOTE — Progress Notes (Signed)
Subjective:     Patient ID: Joseph Johnston, male   DOB: May 03, 1955, 60 y.o.   MRN: [id number]  HPI patient presents stating he's develop progressive numbness in his feet over the last year and he is concerned about this. His heel pain has resolved but he has some forefoot shooting pains also at times   Review of Systems     Objective:   Physical Exam I noted neurological to be diminished both sharp dull and vibratory with diminished signs Weinstein testing. Patient did not have any muscle strength loss or loss of DTR reflexes and I did note the digits are moderately tender around the second metatarsophalangeal joint. 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 I did do a sterile prep and 10 cm proximal to the lateral malleolus I did do punch biopsies and I was able to get adequate nerve tissue which I will send him for evaluation. I will see him back in 6 weeks to review

## 2015-11-07 ENCOUNTER — Encounter: Payer: Self-pay | Admitting: Internal Medicine

## 2015-11-15 ENCOUNTER — Ambulatory Visit (AMBULATORY_SURGERY_CENTER): Payer: Self-pay

## 2015-11-15 VITALS — Ht 70.0 in | Wt 248.8 lb

## 2015-11-15 DIAGNOSIS — Z860100 Personal history of colon polyps, unspecified: Secondary | ICD-10-CM

## 2015-11-15 MED ORDER — SUPREP BOWEL PREP KIT 17.5-3.13-1.6 GM/177ML PO SOLN: 1.0000 | Freq: Once | ORAL | 0 refills | Status: AC

## 2015-11-15 NOTE — Progress Notes (Signed)
No allergies to eggs or soy No past problems with anesthesia No diet meds No home oxygen  Declined emmi 

## 2015-11-19 ENCOUNTER — Encounter: Payer: Self-pay | Admitting: Internal Medicine

## 2015-11-20 ENCOUNTER — Encounter: Payer: Self-pay | Admitting: Family Medicine

## 2015-11-20 ENCOUNTER — Ambulatory Visit (INDEPENDENT_AMBULATORY_CARE_PROVIDER_SITE_OTHER): Payer: Federal, State, Local not specified - PPO | Admitting: Family Medicine

## 2015-11-20 VITALS — BP 120/86 | HR 96 | Ht 70.0 in | Wt 245.8 lb

## 2015-11-20 DIAGNOSIS — R5381 Other malaise: Secondary | ICD-10-CM | POA: Diagnosis not present

## 2015-11-20 DIAGNOSIS — Z79899 Other long term (current) drug therapy: Secondary | ICD-10-CM

## 2015-11-20 DIAGNOSIS — G2581 Restless legs syndrome: Secondary | ICD-10-CM | POA: Diagnosis not present

## 2015-11-20 DIAGNOSIS — Z630 Problems in relationship with spouse or partner: Secondary | ICD-10-CM

## 2015-11-20 DIAGNOSIS — B20 Human immunodeficiency virus [HIV] disease: Secondary | ICD-10-CM | POA: Diagnosis not present

## 2015-11-20 DIAGNOSIS — G44209 Tension-type headache, unspecified, not intractable: Secondary | ICD-10-CM | POA: Diagnosis not present

## 2015-11-20 DIAGNOSIS — R208 Other disturbances of skin sensation: Secondary | ICD-10-CM | POA: Diagnosis not present

## 2015-11-20 LAB — CBC WITH DIFFERENTIAL/PLATELET
BASOS ABS: 0 {cells}/uL (ref 0–200)
Basophils Relative: 0 %
Eosinophils Absolute: 83 cells/uL (ref 15–500)
Eosinophils Relative: 1 %
HCT: 45.2 % (ref 38.5–50.0)
HEMOGLOBIN: 15.8 g/dL (ref 13.2–17.1)
LYMPHS ABS: 3486 {cells}/uL (ref 850–3900)
Lymphocytes Relative: 42 %
MCH: 32.7 pg (ref 27.0–33.0)
MCHC: 35 g/dL (ref 32.0–36.0)
MCV: 93.6 fL (ref 80.0–100.0)
MONO ABS: 498 {cells}/uL (ref 200–950)
MPV: 9.3 fL (ref 7.5–12.5)
Monocytes Relative: 6 %
Neutro Abs: 4233 cells/uL (ref 1500–7800)
Neutrophils Relative %: 51 %
Platelets: 297 10*3/uL (ref 140–400)
RBC: 4.83 MIL/uL (ref 4.20–5.80)
RDW: 13.6 % (ref 11.0–15.0)
WBC: 8.3 10*3/uL (ref 4.0–10.5)

## 2015-11-20 LAB — COMPREHENSIVE METABOLIC PANEL
ALBUMIN: 4.6 g/dL (ref 3.6–5.1)
ALT: 47 U/L — ABNORMAL HIGH (ref 9–46)
AST: 36 U/L — ABNORMAL HIGH (ref 10–35)
Alkaline Phosphatase: 76 U/L (ref 40–115)
BUN: 14 mg/dL (ref 7–25)
CHLORIDE: 107 mmol/L (ref 98–110)
CO2: 22 mmol/L (ref 20–31)
CREATININE: 1.03 mg/dL (ref 0.70–1.33)
Calcium: 9.2 mg/dL (ref 8.6–10.3)
Glucose, Bld: 93 mg/dL (ref 65–99)
POTASSIUM: 4 mmol/L (ref 3.5–5.3)
SODIUM: 139 mmol/L (ref 135–146)
TOTAL PROTEIN: 7.1 g/dL (ref 6.1–8.1)
Total Bilirubin: 0.4 mg/dL (ref 0.2–1.2)

## 2015-11-20 LAB — LIPID PANEL
CHOL/HDL RATIO: 7.2 ratio — AB (ref <=5.0)
CHOLESTEROL: 231 mg/dL — AB (ref 125–200)
HDL: 32 mg/dL — ABNORMAL LOW (ref >=40)
LDL Cholesterol: 151 mg/dL — ABNORMAL HIGH (ref <130)
Triglycerides: 240 mg/dL — ABNORMAL HIGH (ref <150)
VLDL: 48 mg/dL — ABNORMAL HIGH (ref <30)

## 2015-11-20 NOTE — Progress Notes (Signed)
   Subjective:    Patient ID: Joseph Johnston, male    DOB: 01-04-1956, 60 y.o.   MRN: [id number]  HPI He is here for an interval evaluation. He continues on his HIV medications and is having no difficulty with them. He does occasionally have difficulty with shortness of breath however he has stopped working stating that he eventually wants to move to [place]. He notes some shortness of breath with increased physical activity than in the past would not do that. He had PFT testing done in 2015 which was negative He continues on allopurinol and is having no trouble with that. He is not sure if the Uroxatral is really helping with his urinary symptoms. He is under a lot of stress at home. His spouse has his sister living there with them and he is concerned about his spouse becoming and enabler. This is cause some stress in their relationship. He also has noted increased headaches during the same timeframe that this has come to head within the last 3 weeks. He also is presently being evaluated for tingling sensation as well as some pain in his lower extremities. A nerve biopsy is being done by his podiatrist. He does have a previous history of RLSHe on the right side and do have a positional character to it. He has had no fever, chills, cough, congestion, GI issues.   Review of Systems     Objective:   Physical Exam Alert and in no distress. Tympanic membranes and canals are normal. Pharyngeal area is normal. Neck is supple without adenopathy or thyromegaly. Cardiac exam shows a regular sinus rhythm without murmurs or gallops. Lungs are clear to auscultation. Abdominal exam shows no masses or tenderness. No inguinal or axillary adenopathy. Fundi benign.       Assessment & Plan:  Human immunodeficiency virus (HIV) disease ([place]) - Plan: CBC with Differential/Platelet, Comprehensive metabolic panel, Lipid panel, HIV 1 RNA quant-no reflex-bld, T-helper cells (CD4) count (not at [facility])  Marital  stress  RLS (restless legs syndrome)  Dysesthesia  Physical deconditioning  Encounter for long-term (current) use of high-risk medication  Tension headache He will continue on his present medications. I explained that I did not think his shortness of breath was anything significant since he has had previous PFT testing which was negative. Suggested that this is most likely due to deconditioning and encouraged him to become more physically active.. We will wait nerve biopsies to see if this is RLS or possibly some other neuropathy. His custom marital stress and strongly encouraged him to get involved in counseling to help deal with this and also include his husband. No particular therapy for the tension headaches but recognizes that they are multifactorial in terms of head position and proper posturing is needed. Also dealing with the underlying stress. Over 45 minutes, greater than 50% spent in counseling and coordination of care.

## 2015-11-21 LAB — T-HELPER CELLS (CD4) COUNT (NOT AT ARMC)
ABSOLUTE CD4: 1628 {cells}/uL — AB (ref 381–1469)
CD4 T HELPER %: 46 % (ref 32–62)
TOTAL LYMPHOCYTE COUNT: 3516 {cells}/uL — AB (ref 700–3300)

## 2015-11-22 LAB — HIV-1 RNA QUANT-NO REFLEX-BLD: HIV 1 RNA Quant: <20 copies/mL (ref <20)

## 2015-11-27 ENCOUNTER — Encounter: Payer: Federal, State, Local not specified - PPO | Admitting: Internal Medicine

## 2015-12-10 ENCOUNTER — Encounter: Payer: Self-pay | Admitting: Family Medicine

## 2015-12-10 ENCOUNTER — Ambulatory Visit (INDEPENDENT_AMBULATORY_CARE_PROVIDER_SITE_OTHER): Payer: Federal, State, Local not specified - PPO | Admitting: Family Medicine

## 2015-12-10 VITALS — BP 130/80 | HR 94 | Wt 253.0 lb

## 2015-12-10 DIAGNOSIS — R079 Chest pain, unspecified: Secondary | ICD-10-CM | POA: Diagnosis not present

## 2015-12-10 NOTE — Progress Notes (Signed)
   Subjective:    Patient ID: Joseph Johnston, male    DOB: [birth year], 58 y.o.   MRN: [id number]  HPI He is here for reevaluation of continued difficulty with chest pressure. This usually occurs with physical activity. He becomes short of breath and states that it radiates into his left arm and can last up to 2 hours. It does not go away with decreased physical activity. He does not smoke. He desires have a history of asthma although he was given an inhaler to see if that would help. The inhaler did not change his symptoms but only caused increased side effects. He was admitted to the hospital in 2015 and did have cardiac cath which was essentially negative.   Review of Systems     Objective:   Physical Exam Alert and in no distress. Tympanic membranes and canals are normal. Pharyngeal area is normal. Neck is supple without adenopathy or thyromegaly. Cardiac exam shows a regular sinus rhythm without murmurs or gallops. Lungs are clear to auscultation. EKG shows no acute changes. Pulmonary function testing does show moderate restriction.      Assessment & Plan:  Chest pain, unspecified type - Plan: EKG 12-Lead, Spirometry with graph, Ambulatory referral to Pulmonology, DG Chest 2 View He did not respond to an inhaler except having side effects from this. He has had previous cardiac cath in 2015. With this in mind and then have a pulmonary evaluation and if nondiagnostic I can refer back to cardiology.

## 2015-12-11 ENCOUNTER — Ambulatory Visit
Admission: RE | Admit: 2015-12-11 | Discharge: 2015-12-11 | Disposition: A | Discharge status: Home / Self Care | Payer: Federal, State, Local not specified - PPO | Source: Ambulatory Visit | Attending: Family Medicine | Admitting: Family Medicine

## 2015-12-11 DIAGNOSIS — R079 Chest pain, unspecified: Secondary | ICD-10-CM

## 2015-12-12 ENCOUNTER — Ambulatory Visit (INDEPENDENT_AMBULATORY_CARE_PROVIDER_SITE_OTHER): Payer: Federal, State, Local not specified - PPO | Admitting: Podiatry

## 2015-12-12 ENCOUNTER — Encounter: Payer: Self-pay | Admitting: Podiatry

## 2015-12-12 DIAGNOSIS — R2681 Unsteadiness on feet: Secondary | ICD-10-CM

## 2015-12-12 DIAGNOSIS — G629 Polyneuropathy, unspecified: Secondary | ICD-10-CM

## 2015-12-12 NOTE — Progress Notes (Signed)
ncv

## 2015-12-13 ENCOUNTER — Ambulatory Visit (INDEPENDENT_AMBULATORY_CARE_PROVIDER_SITE_OTHER): Payer: Federal, State, Local not specified - PPO | Admitting: Neurology

## 2015-12-13 DIAGNOSIS — R2681 Unsteadiness on feet: Secondary | ICD-10-CM

## 2015-12-13 DIAGNOSIS — G629 Polyneuropathy, unspecified: Secondary | ICD-10-CM | POA: Diagnosis not present

## 2015-12-13 NOTE — Procedures (Signed)
[facility]  [place], [place]  [place], [place] [address] Tel: [phone] Fax:  [phone] Test Date:  12/13/2015  Patient: Joseph Johnston DOB: [birth year] Physician: Narda Amber, DO  Sex: Male Height: 5\' 11"  Ref Phys: Ila Mcgill, DPM  ID#: [id number] Temp: 33.2C Technician: Jerilynn Mages. Dean   Patient Complaints: This is a 60 year old gentleman with biopsy proven small fiber neuropathy referred for evaluation of progressively worsening numbness and tingling of the lower extremities.   NCV & EMG Findings: Extensive electrodiagnostic testing of the right lower extremity and additional studies of the left shows:  1. Bilateral sural and superficial peroneal sensory responses are within normal limits. 2. Bilateral tibial and peroneal motor responses are within normal limits. 3. There is no evidence of active or chronic motor axon loss changes affecting any of the tested muscles. Motor unit configuration and recruitment pattern is within normal limits.  Impression: This is a normal study.   In particular, there is no evidence of a large fiber sensorimotor polyneuropathy or lumbosacral radiculopathy affecting the lower extremities.   ___________________________ Narda Amber, DO    Nerve Conduction Studies Anti Sensory Summary Table   Stim Site NR Peak (ms) Norm Peak (ms) P-T Amp (V) Norm P-T Amp  Left Sup Peroneal Anti Sensory (Ant Lat Mall)  33.2C  12 cm    3.9 <4.6 7.4 >4  Right Sup Peroneal Anti Sensory (Ant Lat Mall)  33.2C  12 cm    3.3 <4.6 4.5 >4  Left Sural Anti Sensory (Lat Mall)  33.2C  Calf    4.3 <4.6 8.8 >4  Right Sural Anti Sensory (Lat Mall)  33.2C  Calf    3.2 <4.6 7.7 >4   Motor Summary Table   Stim Site NR Onset (ms) Norm Onset (ms) O-P Amp (mV) Norm O-P Amp Site1 Site2 Delta-0 (ms) Dist (cm) Vel (m/s) Norm Vel (m/s)  Left Peroneal Motor (Ext Dig Brev)  33.2C  Ankle    4.5 <6.0 2.5 >2.5 B Fib Ankle 9.2 38.0 41 >40  B Fib    13.7  1.7   Poplt B Fib 1.4 10.0 71 >40  Poplt    15.1  1.7         Right Peroneal Motor (Ext Dig Brev)  33.2C  Ankle    5.9 <6.0 2.8 >2.5 B Fib Ankle 6.8 36.0 53 >40  B Fib    12.7  2.2  Poplt B Fib 2.1 10.0 48 >40  Poplt    14.8  2.3         Left Tibial Motor (Abd Hall Brev)  33.2C  Ankle    4.5 <6.0 14.8 >4 Knee Ankle 10.1 40.0 40 >40  Knee    14.6  11.4         Right Tibial Motor (Abd Hall Brev)  33.2C  Ankle    4.8 <6.0 14.7 >4 Knee Ankle 9.4 42.0 45 >40  Knee    14.2  9.2          H Reflex Studies   NR H-Lat (ms) Lat Norm (ms) L-R H-Lat (ms) M-Lat (ms) HLat-MLat (ms)  Left Tibial (Gastroc)  33.2C     33.20 <35 0.54 5.99 27.21  Right Tibial (Gastroc)  33.2C     33.74 <35 0.54 5.99 27.75   EMG   Side Muscle Ins Act Fibs Psw Fasc Number Recrt Dur Dur. Amp Amp. Poly Poly. Comment  Left AntTibialis Nml Nml Nml Nml Nml Nml Nml Nml  Nml Nml Nml Nml N/A  Left PostTibialis Nml Nml Nml Nml Nml Nml Nml Nml Nml Nml Nml Nml N/A  Left Gastroc Nml Nml Nml Nml Nml Nml Nml Nml Nml Nml Nml Nml N/A  Left RectFemoris Nml Nml Nml Nml Nml Nml Nml Nml Nml Nml Nml Nml N/A  Left GluteusMed Nml Nml Nml Nml Nml Nml Nml Nml Nml Nml Nml Nml N/A  Left BicepsFemS Nml Nml Nml Nml Nml Nml Nml Nml Nml Nml Nml Nml N/A  Left Lumbo Parasp Low Nml Nml Nml Nml Nml Nml Nml Nml Nml Nml Nml Nml N/A  Right RectFemoris Nml Nml Nml Nml Nml Nml Nml Nml Nml Nml Nml Nml N/A  Right PostTibialis Nml Nml Nml Nml Nml Nml Nml Nml Nml Nml Nml Nml N/A  Right Gastroc Nml Nml Nml Nml Nml Nml Nml Nml Nml Nml Nml Nml N/A  Right GluteusMed Nml Nml Nml Nml Nml Nml Nml Nml Nml Nml Nml Nml N/A  Right BicepsFemS Nml Nml Nml Nml Nml Nml Nml Nml Nml Nml Nml Nml N/A  Right AntTibialis Nml Nml Nml Nml Nml Nml Nml Nml Nml Nml Nml Nml N/A      Waveforms:

## 2015-12-13 NOTE — Progress Notes (Signed)
Subjective:     Patient ID: Joseph Johnston, male   DOB: Oct 25, 1955, 60 y.o.   MRN: [id number]  HPI patient presents stating that he needs the results of his nerve biopsies as she is noticing increased unsteadiness and also that the numbness seems to be getting worse. 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 At this point we are going to send him for nerve conduction studies and EMGs and then results may dictate further treatment. We are going to go ahead and put him in to balance braces due to gait unsteadiness and tendency to fall and we will get that done as soon as possible when available. Patient will be seen back for me to recheck. I did discuss the results of the nerve biopsies indicating small fiber disease

## 2015-12-19 DIAGNOSIS — G629 Polyneuropathy, unspecified: Secondary | ICD-10-CM

## 2016-01-15 ENCOUNTER — Institutional Professional Consult (permissible substitution): Payer: Federal, State, Local not specified - PPO | Admitting: Pulmonary Disease

## 2016-02-13 ENCOUNTER — Institutional Professional Consult (permissible substitution): Payer: Federal, State, Local not specified - PPO | Admitting: Pulmonary Disease

## 2016-02-20 ENCOUNTER — Ambulatory Visit (INDEPENDENT_AMBULATORY_CARE_PROVIDER_SITE_OTHER): Payer: Federal, State, Local not specified - PPO | Admitting: Family Medicine

## 2016-02-20 VITALS — BP 120/80 | HR 91 | Temp 98.5°F | Wt 257.0 lb

## 2016-02-20 DIAGNOSIS — J111 Influenza due to unidentified influenza virus with other respiratory manifestations: Secondary | ICD-10-CM

## 2016-02-20 DIAGNOSIS — R509 Fever, unspecified: Secondary | ICD-10-CM | POA: Diagnosis not present

## 2016-02-20 LAB — POC INFLUENZA A&B (BINAX/QUICKVUE)
INFLUENZA A, POC: NEGATIVE
Influenza B, POC: NEGATIVE

## 2016-02-20 NOTE — Progress Notes (Signed)
   Subjective:    Patient ID: Joseph Johnston, male    DOB: 10-31-1955, 61 y.o.   MRN: [id number]  HPI He has a five-day history that started with a slight sore throat, myalgias, dizziness, cough, malaise with fatigue.   Review of Systems     Objective:   Physical Exam Alert and in no distress. Tympanic membranes and canals are normal. Pharyngeal area is normal. Neck is supple without adenopathy or thyromegaly. Cardiac exam shows a regular sinus rhythm without murmurs or gallops. Lungs are clear to auscultation. Flu test negative       Assessment & Plan:  Fever and chills - Plan: POC Influenza A&B (Binax test)  Influenza His symptoms are most consistent with influenza. Recommend supportive care. Discussed treatment with Tamiflu and at this late date, it would not be useful. He is comfortable with that.

## 2016-02-20 NOTE — Patient Instructions (Signed)

## 2016-02-29 ENCOUNTER — Ambulatory Visit (INDEPENDENT_AMBULATORY_CARE_PROVIDER_SITE_OTHER): Payer: Federal, State, Local not specified - PPO | Admitting: Family Medicine

## 2016-02-29 ENCOUNTER — Encounter: Payer: Self-pay | Admitting: Family Medicine

## 2016-02-29 VITALS — BP 118/80 | HR 90 | Wt 254.0 lb

## 2016-02-29 DIAGNOSIS — D171 Benign lipomatous neoplasm of skin and subcutaneous tissue of trunk: Secondary | ICD-10-CM

## 2016-02-29 DIAGNOSIS — M549 Dorsalgia, unspecified: Secondary | ICD-10-CM

## 2016-02-29 DIAGNOSIS — G629 Polyneuropathy, unspecified: Secondary | ICD-10-CM

## 2016-02-29 DIAGNOSIS — L918 Other hypertrophic disorders of the skin: Secondary | ICD-10-CM

## 2016-02-29 LAB — POCT URINALYSIS DIPSTICK
BILIRUBIN UA: NEGATIVE
Blood, UA: NEGATIVE
Glucose, UA: NEGATIVE
KETONES UA: NEGATIVE
LEUKOCYTES UA: NEGATIVE
Nitrite, UA: NEGATIVE
Protein, UA: NEGATIVE
Spec Grav, UA: 1.015
Urobilinogen, UA: NEGATIVE
pH, UA: 6

## 2016-02-29 MED ORDER — AMITRIPTYLINE HCL 10 MG PO TABS: 10.0000 mg | ORAL_TABLET | Freq: Every day | ORAL | 3 refills | Status: DC

## 2016-02-29 NOTE — Patient Instructions (Signed)
Start on the amitriptyline at night and call me in one week using My Chart

## 2016-02-29 NOTE — Progress Notes (Signed)
   Subjective:    Patient ID: Joseph Johnston, male    DOB: [birth year], 61 y.o.   MRN: [id number]  HPI He is here for multiple reasons. He has a several day history of intermittent right CVA discomfort is made worse with motion. No history of injury or overuse. No frequency, urgency or discharge. He continues have difficulty with foot and leg discomfort. He was seen by podiatry and apparently biopsies were done which did show a neuropathy. Follow-up nerve conduction studies were negative for motor deficit. He did not hear back from Dr. Paulla Dolly concerning this in spite of leaving a message. He also complains of skin tags in the inner thigh area that are interfering with his walking. He has been using boxer briefs but is getting no relief of his symptoms.   Review of Systems     Objective:   Physical Exam Alert and in no distress. Exam of his back does show a lipoma in the right CVA. Good motion of the back. No tenderness to palpation. Exam of the inguinal area does show some chafing and roughening of the skin bilaterally that appear to be kissing type lesions. Lower extremities not examined.       Assessment & Plan:  Neuropathy ([place])  Lipoma of torso  Other acute back pain  Skin tags, multiple acquired I reassured him that the lipomas nothing to worry about. Recommend stretching exercises for his back. He will see his dermatologist concerning the skin lesions in the inguinal area. Discussed the neuropathy with him. I will place him on amitriptyline and increase the dosing based on his symptomatology. Discussed the use of other medications. Will also potentially refer back to podiatry especially if he has difficulty with ambulation.

## 2016-03-07 ENCOUNTER — Encounter: Payer: Self-pay | Admitting: Family Medicine

## 2016-03-10 ENCOUNTER — Encounter: Payer: Self-pay | Admitting: Family Medicine

## 2016-03-14 ENCOUNTER — Encounter: Payer: Self-pay | Admitting: Family Medicine

## 2016-03-14 ENCOUNTER — Other Ambulatory Visit: Payer: Self-pay | Admitting: Family Medicine

## 2016-03-14 MED ORDER — PREGABALIN 75 MG PO CAPS: 75.0000 mg | ORAL_CAPSULE | Freq: Two times a day (BID) | ORAL | 0 refills | Status: DC

## 2016-03-20 ENCOUNTER — Ambulatory Visit (INDEPENDENT_AMBULATORY_CARE_PROVIDER_SITE_OTHER): Payer: Federal, State, Local not specified - PPO | Admitting: Pulmonary Disease

## 2016-03-20 ENCOUNTER — Encounter: Payer: Self-pay | Admitting: Pulmonary Disease

## 2016-03-20 VITALS — BP 132/82 | HR 104 | Ht 71.0 in | Wt 257.4 lb

## 2016-03-20 DIAGNOSIS — R0602 Shortness of breath: Secondary | ICD-10-CM | POA: Diagnosis not present

## 2016-03-20 DIAGNOSIS — R0609 Other forms of dyspnea: Secondary | ICD-10-CM | POA: Diagnosis not present

## 2016-03-20 DIAGNOSIS — R0789 Other chest pain: Secondary | ICD-10-CM | POA: Diagnosis not present

## 2016-03-20 NOTE — Assessment & Plan Note (Signed)
He describes dyspnea on exertion which is also worse sometimes while lying flat. While obstructive sleep apnea may contribute, I do worry about the possibility of right hemidiaphragm paralysis considering the imaging abnormality reviewed on his chest x-ray.  Plan: To evaluate this further we will obtain a lung function test, CT scan of his chest as well as a diaphragm fluoroscopy

## 2016-03-20 NOTE — Progress Notes (Signed)
Subjective:    Patient ID: Joseph Johnston, male    DOB: 1955/11/21, 61 y.o.   MRN: [id number]  Synopsis: referred in 02/2016 for chest tightness and dyspnea  HPI Chief Complaint  Patient presents with  . pulmonary consult    Pt c/o chest heaviness, sob & chest tightness X 2y.    [name] has been referred by his PCP Dr. Redmond School for chest pain and pressure.  He says that this has been going on for a few years.  Two years ago he had a left heart catheterization it sounds like to evaluate this.  He says that he notices the pain in the mid day most days.  It can occur when he is walking, even short distances.  He will also notice the pain in the middle of the night as well and it will wake him up in the middle of the night.  It feels like a pressure sittin gon the center of his chest and it does not radiate.  It is associated with dyspnea.  No cough.  No weight loss, he has actually gained wait.  Trying to relax, not move ,and meditaing makes him breathe better and the pain will go away.  The pain will go away temporarily but it is fairly constant, it just flares up to worsening level.  He has not had GERD symptoms nor has he tried treating this.  He smoked as much as 2-3 packs per day for 11 years, quit age 58.    He has family history of lung cancer, his brothers and sisters.    Past Medical History:  Diagnosis Date  . Allergic rhinitis   . Angina pectoris ([place])   . Anxiety    takes Sam-E with much success  . Diverticulitis   . Dyslipidemia   . Gout   . 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 History of cardiac cath    2015 EF 55-65%  . HIV (human immunodeficiency virus infection) ([place])   . Hx of colonic polyps   . Hypercholesterolemia   . Hyperlipidemia   . Hypertension   . Restless leg   . Seborrheic dermatitis   . Ulcerative colitis      Family History  Problem Relation Age of Onset  . Coronary artery disease Sister 55    MI  . Hyperlipidemia Mother   . Hypertension Mother   .  Diabetes Mother   . Colon polyps Mother   . Hyperlipidemia Father   . Hypertension Father   . Diabetes Father   . Colon cancer Father   . Colon cancer Brother   . Hyperlipidemia Sister   . Hyperlipidemia Brother   . Hypertension Brother   . Diabetes Sister   . Diabetes Brother   . Colon polyps Sister      Social History   Social History  . Marital status: Single    Spouse name: N/A  . Number of children: N/A  . Years of education: N/A   Occupational History  . [place] History Main Topics  . Smoking status: Former Smoker    Packs/day: 3.00    Years: 30.00    Quit date: 01/27/1994  . Smokeless tobacco: Never Used  . Alcohol use No     Comment: Former alcholic; none since [id number]  . Drug use: No  . Sexual activity: Yes    Partners: Male     Comment: pt declined condoms   Other Topics Concern  .  Not on file   Social History Narrative  . No narrative on file     Allergies  Allergen Reactions  . Morphine And Related Other (See Comments)    Bradycardia and hypotension     Outpatient Medications Prior to Visit  Medication Sig Dispense Refill  . alfuzosin (UROXATRAL) 10 MG 24 hr tablet Take 1 tablet (10 mg total) by mouth daily with breakfast. 90 tablet 3  . allopurinol (ZYLOPRIM) 300 MG tablet Take 1 tablet (300 mg total) by mouth daily. 90 tablet 3  . amitriptyline (ELAVIL) 10 MG tablet Take 1 tablet (10 mg total) by mouth at bedtime. 90 tablet 3  . aspirin 81 MG tablet Take 81 mg by mouth daily.    . B Complex-C (SUPER B COMPLEX PO) Take 1 tablet by mouth daily.    . Coenzyme Q10 (CO Q 10 PO) Take by mouth. One daily    . KRILL OIL PO Take 1 tablet by mouth daily.    . Multiple Vitamin (MULTIVITAMIN) capsule Take 1 capsule by mouth daily.    . pregabalin (LYRICA) 75 MG capsule Take 1 capsule (75 mg total) by mouth 2 (two) times daily. 14 capsule 0  . PRESCRIPTION MEDICATION Sumatriptor 20mg  once weekly headaches    .  S-Adenosylmethionine (SAM-E PO) Take 400 mg by mouth.    Marland Kitchen TIVICAY 50 MG tablet TAKE 1 TABLET DAILY 90 tablet 3  . TRUVADA 200-300 MG tablet TAKE 1 TABLET DAILY 90 tablet 3  . VENTOLIN HFA 108 (90 Base) MCG/ACT inhaler INHALE TWO PUFFS BY MOUTH EVERY 6 HOURS AS NEEDED FOR WHEEZING OR SHORTNESS OF BREATH 18 each 0   No facility-administered medications prior to visit.       Review of Systems  Constitutional: Negative for fever and unexpected weight change.  HENT: Negative for congestion, dental problem, ear pain, nosebleeds, postnasal drip, rhinorrhea, sinus pressure, sneezing, sore throat and trouble swallowing.   Eyes: Negative for redness and itching.  Respiratory: Positive for chest tightness and shortness of breath. Negative for cough and wheezing.   Cardiovascular: Negative for palpitations and leg swelling.  Gastrointestinal: Negative for nausea and vomiting.  Genitourinary: Negative for dysuria.  Musculoskeletal: Negative for joint swelling.  Skin: Negative for rash.  Neurological: Negative for headaches.  Hematological: Does not bruise/bleed easily.  Psychiatric/Behavioral: Negative for dysphoric mood. The patient is not nervous/anxious.        Objective:   Physical Exam  Vitals:   03/20/16 1027  BP: 132/82  Pulse: (!) 104  SpO2: 97%  Weight: 257 lb 6.4 oz (116.8 kg)  Height: 5\' 11"  (1.803 m)   RA  Gen: well appearing, no acute distress HENT: NCAT, OP clear, neck supple without masses Eyes: PERRL, EOMi Lymph: no cervical lymphadenopathy PULM: CTA B CV: RRR, no mgr, no JVD GI: BS+, soft, nontender, no hsm Derm: no rash or skin breakdown MSK: normal bulk and tone Neuro: A&Ox4, CN II-XII intact, strength 5/5 in all 4 extremities Psyche: normal mood and affect   November 2017 chest x-ray images personally reviewed showing: Elevation of the right hemidiaphragm, normal pulmonary parenchyma otherwise  2015 Left heart cath: Final Conclusions:   1. No  significant CAD 2. Normal LV function.    CBC    Component Value Date/Time   WBC 8.3 11/20/2015 1429   RBC 4.83 11/20/2015 1429   HGB 15.8 11/20/2015 1429   HCT 45.2 11/20/2015 1429   PLT 297 11/20/2015 1429   MCV 93.6 11/20/2015 1429  MCH 32.7 11/20/2015 1429   MCHC 35.0 11/20/2015 1429   RDW 13.6 11/20/2015 1429   LYMPHSABS 3,486 11/20/2015 1429   MONOABS 498 11/20/2015 1429   EOSABS 83 11/20/2015 1429   BASOSABS 0 11/20/2015 1429       Assessment & Plan:  Dyspnea He describes dyspnea on exertion which is also worse sometimes while lying flat. While obstructive sleep apnea may contribute, I do worry about the possibility of right hemidiaphragm paralysis considering the imaging abnormality reviewed on his chest x-ray.  Plan: To evaluate this further we will obtain a lung function test, CT scan of his chest as well as a diaphragm fluoroscopy  Chest pain His chest pain is a bit atypical. I'm reassured by his normal left heart catheterization several years ago. I cannot see how diaphragm paralysis would contribute to this.  His symptoms could be explained by GERD.  I recommended that he try taking antacid therapy twice a day to see if this helps.    Current Outpatient Prescriptions:  .  alfuzosin (UROXATRAL) 10 MG 24 hr tablet, Take 1 tablet (10 mg total) by mouth daily with breakfast., Disp: 90 tablet, Rfl: 3 .  allopurinol (ZYLOPRIM) 300 MG tablet, Take 1 tablet (300 mg total) by mouth daily., Disp: 90 tablet, Rfl: 3 .  amitriptyline (ELAVIL) 10 MG tablet, Take 1 tablet (10 mg total) by mouth at bedtime., Disp: 90 tablet, Rfl: 3 .  aspirin 81 MG tablet, Take 81 mg by mouth daily., Disp: , Rfl:  .  B Complex-C (SUPER B COMPLEX PO), Take 1 tablet by mouth daily., Disp: , Rfl:  .  Coenzyme Q10 (CO Q 10 PO), Take by mouth. One daily, Disp: , Rfl:  .  KRILL OIL PO, Take 1 tablet by mouth daily., Disp: , Rfl:  .  Multiple Vitamin (MULTIVITAMIN) capsule, Take 1 capsule by  mouth daily., Disp: , Rfl:  .  pregabalin (LYRICA) 75 MG capsule, Take 1 capsule (75 mg total) by mouth 2 (two) times daily., Disp: 14 capsule, Rfl: 0 .  PRESCRIPTION MEDICATION, Sumatriptor 20mg  once weekly headaches, Disp: , Rfl:  .  S-Adenosylmethionine (SAM-E PO), Take 400 mg by mouth., Disp: , Rfl:  .  TIVICAY 50 MG tablet, TAKE 1 TABLET DAILY, Disp: 90 tablet, Rfl: 3 .  TRUVADA 200-300 MG tablet, TAKE 1 TABLET DAILY, Disp: 90 tablet, Rfl: 3 .  VENTOLIN HFA 108 (90 Base) MCG/ACT inhaler, INHALE TWO PUFFS BY MOUTH EVERY 6 HOURS AS NEEDED FOR WHEEZING OR SHORTNESS OF BREATH, Disp: 18 each, Rfl: 0

## 2016-03-20 NOTE — Assessment & Plan Note (Signed)
His chest pain is a bit atypical. I'm reassured by his normal left heart catheterization several years ago. I cannot see how diaphragm paralysis would contribute to this.  His symptoms could be explained by GERD.  I recommended that he try taking antacid therapy twice a day to see if this helps.

## 2016-03-20 NOTE — Patient Instructions (Signed)
We will arrange a lung function test, CT scan, and diaphragm fluoroscopy to evaluate whether or not your right diaphragm is moving We will see you back in 3-4 weeks to go over these results  I recommend that you take an over-the-counter antacid like Pepcid twice a day for the next month to see if this helps with the chest pain.

## 2016-03-21 ENCOUNTER — Encounter: Payer: Self-pay | Admitting: Family Medicine

## 2016-03-24 ENCOUNTER — Other Ambulatory Visit: Payer: Self-pay

## 2016-03-24 DIAGNOSIS — G629 Polyneuropathy, unspecified: Secondary | ICD-10-CM

## 2016-03-31 ENCOUNTER — Ambulatory Visit (HOSPITAL_COMMUNITY): Payer: Federal, State, Local not specified - PPO

## 2016-03-31 ENCOUNTER — Other Ambulatory Visit (HOSPITAL_COMMUNITY): Payer: Federal, State, Local not specified - PPO

## 2016-04-03 ENCOUNTER — Telehealth: Payer: Self-pay | Admitting: Pulmonary Disease

## 2016-04-03 NOTE — Telephone Encounter (Signed)
lmtcb x1 for pt. 

## 2016-04-04 ENCOUNTER — Encounter: Payer: Self-pay | Admitting: Neurology

## 2016-04-04 NOTE — Telephone Encounter (Signed)
Dr. Lake Bells   Please Advise-  Pt is concerned because he received a phone call about his covered cost to have the pft and sniff test and each are going to cost him over $300 dollars. Pt states he has been out of work and there is no way he can afford this. His concern is that he is still having these breathing problems but can not afford the testing you are requesting.

## 2016-04-07 NOTE — Telephone Encounter (Signed)
Spoke with pt. He is aware of BQ's response. Nothing further was needed.

## 2016-04-07 NOTE — Telephone Encounter (Signed)
lmtcb x1 for pt. 

## 2016-04-07 NOTE — Telephone Encounter (Signed)
OK to hold off on these tests for now

## 2016-04-24 ENCOUNTER — Ambulatory Visit: Payer: Federal, State, Local not specified - PPO | Admitting: Pulmonary Disease

## 2016-05-13 ENCOUNTER — Ambulatory Visit (INDEPENDENT_AMBULATORY_CARE_PROVIDER_SITE_OTHER): Payer: Federal, State, Local not specified - PPO | Admitting: Family Medicine

## 2016-05-13 ENCOUNTER — Encounter: Payer: Self-pay | Admitting: Family Medicine

## 2016-05-13 VITALS — BP 120/82 | HR 81 | Temp 98.1°F | Wt 256.0 lb

## 2016-05-13 DIAGNOSIS — B20 Human immunodeficiency virus [HIV] disease: Secondary | ICD-10-CM | POA: Diagnosis not present

## 2016-05-13 DIAGNOSIS — J4520 Mild intermittent asthma, uncomplicated: Secondary | ICD-10-CM | POA: Diagnosis not present

## 2016-05-13 DIAGNOSIS — H6692 Otitis media, unspecified, left ear: Secondary | ICD-10-CM

## 2016-05-13 MED ORDER — AMOXICILLIN-POT CLAVULANATE 875-125 MG PO TABS: 1.0000 | ORAL_TABLET | Freq: Two times a day (BID) | ORAL | 0 refills | Status: DC

## 2016-05-13 NOTE — Progress Notes (Signed)
   Subjective:    Patient ID: Joseph Johnston, male    DOB: 06/22/1955, 61 y.o.   MRN: [id number]  HPI He complains of a two-week history of started with a scratchy throat followed by nasal congestion, chest congestion, coughing and now sore throat as well as some neck discomfort, left earache, fever or chills, fatigue and malaise. He does not smoke and has no allergies. He continues on his HIV medications. He has noted some wheezing at night and has used some albuterol.   Review of Systems     Objective:   Physical Exam Alert and in no distress. Tympanic membrane on the left is slightly erythematous, right is normal. Canals normal.. Pharyngeal area is normal. Neck is supple without adenopathy or thyromegaly. Cardiac exam shows a regular sinus rhythm without murmurs or gallops. Lungs are clear to auscultation.        Assessment & Plan:  Left otitis media, unspecified otitis media type - Plan: amoxicillin-clavulanate (AUGMENTIN) 875-125 MG tablet  Mild intermittent asthmatic bronchitis without complication - Plan: amoxicillin-clavulanate (AUGMENTIN) 875-125 MG tablet  Human immunodeficiency virus (HIV) disease ([place]) I will treat him with Augmentin and recommend he continue to use his albuterol inhaler as needed. He uses this mainly when he gets bronchitis. He will return here in one month for follow-up on his HIV status.

## 2016-05-19 ENCOUNTER — Other Ambulatory Visit: Payer: Self-pay | Admitting: Family Medicine

## 2016-05-20 ENCOUNTER — Ambulatory Visit: Payer: Federal, State, Local not specified - PPO | Admitting: Family Medicine

## 2016-05-21 ENCOUNTER — Encounter: Payer: Self-pay | Admitting: Family Medicine

## 2016-05-22 ENCOUNTER — Other Ambulatory Visit: Payer: Self-pay | Admitting: Family Medicine

## 2016-05-27 DIAGNOSIS — G629 Polyneuropathy, unspecified: Secondary | ICD-10-CM

## 2016-05-27 HISTORY — DX: Polyneuropathy, unspecified: G62.9

## 2016-06-04 ENCOUNTER — Ambulatory Visit: Payer: Federal, State, Local not specified - PPO | Admitting: Neurology

## 2016-06-12 ENCOUNTER — Ambulatory Visit (INDEPENDENT_AMBULATORY_CARE_PROVIDER_SITE_OTHER): Payer: Federal, State, Local not specified - PPO | Admitting: Family Medicine

## 2016-06-12 ENCOUNTER — Encounter: Payer: Self-pay | Admitting: Family Medicine

## 2016-06-12 VITALS — BP 112/70 | HR 109 | Ht 71.0 in | Wt 255.0 lb

## 2016-06-12 DIAGNOSIS — G629 Polyneuropathy, unspecified: Secondary | ICD-10-CM

## 2016-06-12 DIAGNOSIS — B20 Human immunodeficiency virus [HIV] disease: Secondary | ICD-10-CM

## 2016-06-12 DIAGNOSIS — K120 Recurrent oral aphthae: Secondary | ICD-10-CM | POA: Diagnosis not present

## 2016-06-12 DIAGNOSIS — Z79899 Other long term (current) drug therapy: Secondary | ICD-10-CM

## 2016-06-12 LAB — COMPREHENSIVE METABOLIC PANEL
ALBUMIN: 4.8 g/dL (ref 3.6–5.1)
ALK PHOS: 75 U/L (ref 40–115)
ALT: 59 U/L — AB (ref 9–46)
AST: 41 U/L — AB (ref 10–35)
BILIRUBIN TOTAL: 0.6 mg/dL (ref 0.2–1.2)
BUN: 9 mg/dL (ref 7–25)
CALCIUM: 9.9 mg/dL (ref 8.6–10.3)
CO2: 23 mmol/L (ref 20–31)
CREATININE: 1.03 mg/dL (ref 0.70–1.33)
Chloride: 103 mmol/L (ref 98–110)
Glucose, Bld: 107 mg/dL — ABNORMAL HIGH (ref 65–99)
Potassium: 4.2 mmol/L (ref 3.5–5.3)
SODIUM: 139 mmol/L (ref 135–146)
TOTAL PROTEIN: 7.4 g/dL (ref 6.1–8.1)

## 2016-06-12 LAB — LIPID PANEL
CHOLESTEROL: 223 mg/dL — AB (ref <200)
HDL: 28 mg/dL — AB (ref >40)
LDL Cholesterol: 127 mg/dL — ABNORMAL HIGH (ref <100)
TRIGLYCERIDES: 339 mg/dL — AB (ref <150)
Total CHOL/HDL Ratio: 8 Ratio — ABNORMAL HIGH (ref <5.0)
VLDL: 68 mg/dL — ABNORMAL HIGH (ref <30)

## 2016-06-12 LAB — CBC WITH DIFFERENTIAL/PLATELET
BASOS PCT: 0 %
Basophils Absolute: 0 cells/uL (ref 0–200)
Eosinophils Absolute: 70 cells/uL (ref 15–500)
Eosinophils Relative: 1 %
HEMATOCRIT: 46.4 % (ref 38.5–50.0)
HEMOGLOBIN: 16.3 g/dL (ref 13.2–17.1)
Lymphocytes Relative: 45 %
Lymphs Abs: 3150 cells/uL (ref 850–3900)
MCH: 32.8 pg (ref 27.0–33.0)
MCHC: 35.1 g/dL (ref 32.0–36.0)
MCV: 93.4 fL (ref 80.0–100.0)
MONO ABS: 420 {cells}/uL (ref 200–950)
MPV: 9.1 fL (ref 7.5–12.5)
Monocytes Relative: 6 %
NEUTROS PCT: 48 %
Neutro Abs: 3360 cells/uL (ref 1500–7800)
Platelets: 263 10*3/uL (ref 140–400)
RBC: 4.97 MIL/uL (ref 4.20–5.80)
RDW: 13.5 % (ref 11.0–15.0)
WBC: 7 10*3/uL (ref 4.0–10.5)

## 2016-06-12 NOTE — Progress Notes (Signed)
   Subjective:    Patient ID: Joseph Johnston, male    DOB: 1955/07/11, 60 y.o.   MRN: [id number]  HPI He is here for a follow-up visit. He is now back to his normal state of health. And tinea is on his HIV medications. He has enough medication already at home to last him at least 6 months. He was seen recently by Dr.Hewitt and is now being treated for neuropathy with amitriptyline 10 mg. States that he does not know much of an improvement in his tingling sensation. He continues on medications listed in the chart. There were reviewed with him. He is having no difficulty with any these medications. No fever, chills, weight change, headache, nausea or vomiting. He does however complain of a painful lesion underneath his tongue.   Review of Systems     Objective:   Physical Exam Alert and in no distress. Tympanic membranes and canals are normal. Pharyngeal area is normal except for an ulcerated lesion near the frenulum of the tongue. Neck is supple without adenopathy or thyromegaly. Cardiac exam shows a regular sinus rhythm without murmurs or gallops. Lungs are clear to auscultation. Abdominal exam shows no masses or tenderness.       Assessment & Plan:  Human immunodeficiency virus (HIV) disease ([place]) - Plan: CBC with Differential/Platelet, Comprehensive metabolic panel, Lipid panel, HIV 1 RNA quant-no reflex-bld, T-helper cells (CD4) count (not at [facility])  Aphthous ulcer of mouth  Neuropathy  Encounter for long-term (current) use of high-risk medication - Plan: CBC with Differential/Platelet, Comprehensive metabolic panel, Lipid panel, HIV 1 RNA quant-no reflex-bld, T-helper cells (CD4) count (not at [facility]) Oh do routine blood screening. Recommend he might want to increase his amitriptyline to 20 mg to see if this will help. Also discussed the treatment of the ulcer at this time no intervention is entertained.

## 2016-06-13 LAB — T-HELPER CELLS (CD4) COUNT (NOT AT ARMC)
ABSOLUTE CD4: 1330 {cells}/uL (ref 490–1740)
CD4 T HELPER %: 43 % (ref 30–61)
TOTAL LYMPHOCYTE COUNT: 3062 {cells}/uL (ref 850–3900)

## 2016-06-14 LAB — HIV-1 RNA QUANT-NO REFLEX-BLD
HIV 1 RNA Quant: <20 copies/mL
HIV-1 RNA Quant, Log: <1.3 Log copies/mL

## 2016-07-21 ENCOUNTER — Telehealth: Payer: Self-pay | Admitting: *Deleted

## 2016-07-21 ENCOUNTER — Ambulatory Visit (INDEPENDENT_AMBULATORY_CARE_PROVIDER_SITE_OTHER): Payer: Federal, State, Local not specified - PPO | Admitting: Diagnostic Neuroimaging

## 2016-07-21 ENCOUNTER — Encounter: Payer: Self-pay | Admitting: Diagnostic Neuroimaging

## 2016-07-21 VITALS — BP 148/97 | HR 89 | Ht 71.0 in | Wt 254.0 lb

## 2016-07-21 DIAGNOSIS — G609 Hereditary and idiopathic neuropathy, unspecified: Secondary | ICD-10-CM | POA: Diagnosis not present

## 2016-07-21 NOTE — Patient Instructions (Signed)
Thank you for coming to see [place] at [facility]. I hope we have been able to provide you high quality care today.  You may receive a patient satisfaction survey over the next few weeks. We would appreciate your feedback and comments so that we may continue to improve ourselves and the health of our patients.  - trial of neuropathy cream  - check labs today   ~~~~~~~~~~~~~~~~~~~~~~~~~~~~~~~~~~~~~~~~~~~~~~~~~~~~~~~~~~~~~~~~~  DR. PENUMALLI'S GUIDE TO HAPPY AND HEALTHY LIVING These are some of my general health and wellness recommendations. Some of them may apply to you better than others. Please use common sense as you try these suggestions and feel free to ask me any questions.   ACTIVITY/FITNESS Mental, social, emotional and physical stimulation are very important for brain and body health. Try learning a new activity (arts, music, language, sports, games).  Keep moving your body to the best of your abilities. You can do this at home, inside or outside, the park, community center, gym or anywhere you like. Consider a physical therapist or personal trainer to get started. Consider the app Sworkit. Fitness trackers such as smart-watches, smart-phones or Fitbits can help as well.   NUTRITION Eat more plants: colorful vegetables, nuts, seeds and berries.  Eat less sugar, salt, preservatives and processed foods.  Avoid toxins such as cigarettes and alcohol.  Drink water when you are thirsty. Warm water with a slice of lemon is an excellent morning drink to start the day.  Consider these websites for more information The Nutrition Source (https://www.henry-hernandez.biz/) Precision Nutrition (WindowBlog.ch)   RELAXATION Consider practicing mindfulness meditation or other relaxation techniques such as deep breathing, prayer, yoga, tai chi, massage. See website mindful.org or the apps Headspace or Calm to help get  started.   SLEEP Try to get at least 7-8+ hours sleep per day. Regular exercise and reduced caffeine will help you sleep better. Practice good sleep hygeine techniques. See website sleep.org for more information.   PLANNING Prepare estate planning, living will, healthcare POA documents. Sometimes this is best planned with the help of an attorney. Theconversationproject.org and agingwithdignity.org are excellent resources.

## 2016-07-21 NOTE — Progress Notes (Signed)
[facility]  PATIENT: Joseph Johnston DOB: [birth year]  REFERRING CLINICIAN: Lewanda Rife HISTORY FROM: patient  REASON FOR VISIT: new consult    HISTORICAL  CHIEF COMPLAINT:  Chief Complaint  Patient presents with  . Idiopathic peripheral neuropathy    rm 7, New Pt, "feet and legs numb, hurting when walking, standing x 3 years, getting worse"    HISTORY OF PRESENT ILLNESS:   61 year old male here for evaluation of pain in feet. Patient has history of HIV diagnosed in 2013. Patient started having pain with standing and walking in approximately 2015. Symptoms are intermittent and then improved. When symptoms worsen patient went to Dr. Paulla Dolly podiatry, had skin biopsy, and was diagnosed with possible small fiber neuropathy. Patient then had EMG nerve conduction study by Dr. Posey Pronto which was negative for large fiber neuropathy. Patient tried amitriptyline which caused drowsiness and abnormal dreams. Patient tried Lyrica which also cause abnormal dreams. Patient then went to Dr. Doran Durand who evaluated patient and suspected idiopathic neuropathy. Patient was then referred to me for neurologic consultation.  Patient continues to have intermittent pain, numbness, thick sensation in feet. No significant low back pain with radiating symptoms. Patient is on HIV treatment with good control.    REVIEW OF SYSTEMS: Full 14 system review of systems performed and negative with exceptioweight gain fatigue swelling in legs snoring urination problems recent thoughts anxiety restless leg snoring headache numbness weakness.   ALLERGIES: Allergies  Allergen Reactions  . Elavil [Amitriptyline Hcl] Other (See Comments)    "Bad dreams, difficulty breathing when I woke up"  . Lyrica [Pregabalin] Other (See Comments)    "bad dreams "  . Morphine And Related Other (See Comments)    Bradycardia and hypotension    HOME MEDICATIONS: Outpatient Medications Prior to Visit  Medication Sig  Dispense Refill  . alfuzosin (UROXATRAL) 10 MG 24 hr tablet TAKE ONE TABLET BY MOUTH ONCE DAILY WITH  BREAKFAST 90 tablet 3  . allopurinol (ZYLOPRIM) 300 MG tablet TAKE ONE TABLET BY MOUTH ONCE DAILY 90 tablet 3  . aspirin 81 MG tablet Take 81 mg by mouth daily.    . B Complex-C (SUPER B COMPLEX PO) Take 1 tablet by mouth daily.    . Coenzyme Q10 (CO Q 10 PO) Take by mouth. One daily    . KRILL OIL PO Take 1 tablet by mouth daily.    . Multiple Vitamin (MULTIVITAMIN) capsule Take 1 capsule by mouth daily.    Marland Kitchen PRESCRIPTION MEDICATION Sumatriptor 10m once weekly headaches    . S-Adenosylmethionine (SAM-E PO) Take 400 mg by mouth.    .Marland KitchenTIVICAY 50 MG tablet TAKE 1 TABLET DAILY 90 tablet 3  . TRUVADA 200-300 MG tablet TAKE 1 TABLET DAILY 90 tablet 3  . VENTOLIN HFA 108 (90 Base) MCG/ACT inhaler INHALE TWO PUFFS BY MOUTH EVERY 6 HOURS AS NEEDED FOR WHEEZING OR SHORTNESS OF BREATH 18 each 0  . amitriptyline (ELAVIL) 10 MG tablet Take 1 tablet (10 mg total) by mouth at bedtime. (Patient not taking: Reported on 07/21/2016) 90 tablet 3   No facility-administered medications prior to visit.     PAST MEDICAL HISTORY: Past Medical History:  Diagnosis Date  . Allergic rhinitis   . [place]ina p[place]is [place]   . Anxiety    takes Sam-E with much success  . Diverticulitis   . Dyslipidemia   . Gout   . 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 History of cardiac cath    2015  EF 55-65%  . HIV (human immunodeficiency virus infection) ([place])   . Hx of colonic polyps   . Hypercholesterolemia   . Hyperlipidemia   . Hypertension   . Peripheral neuropathy 05/27/2016  . Restless leg   . Seborrheic dermatitis   . Ulcerative colitis     PAST SURGICAL HISTORY: Past Surgical History:  Procedure Laterality Date  . COLONOSCOPY  11/26/09  . LEFT HEART CATHETERIZATION WITH CORONARY ANGIOGRAM N/A 09/16/2013   Procedure: LEFT HEART CATHETERIZATION WITH CORONARY ANGIOGRAM;  Surgeon: Peter M Martinique, MD;  Location[facility] CATH LAB;   Service: Cardiovascular;  Laterality: N/A;  . Left shoulder surgery Left 2004    FAMILY HISTORY: Family History  Problem Relation Age of Onset  . Coronary artery disease Sister 109       MI  . Hyperlipidemia Mother   . Hypertension Mother   . Diabetes Mother   . Colon polyps Mother   . Hyperlipidemia Father   . Hypertension Father   . Diabetes Father   . Colon cancer Father   . Colon cancer Brother   . Hyperlipidemia Sister   . Hyperlipidemia Brother   . Hypertension Brother   . Diabetes Sister   . Colon polyps Sister   . Diabetes Brother   . Other Sister        car accident  . Heart attack Brother   . Other Brother        crib death    SOCIAL HISTORY:  Social History   Social History  . Marital status: Married    Spouse name: Joseph Johnston  . Number of children: 0  . Years of education: GED   Occupational History  . [employer]    07/21/16 unemployed   Social History Main Topics  . Smoking status: Former Smoker    Packs/day: 3.00    Years: 30.00    Quit date: 01/27/1994  . Smokeless tobacco: Never Used  . Alcohol use No     Comment: Former alcholic; none since 4562  . Drug use: No  . Sexual activity: Yes    Partners: Male     Comment: pt declined condoms   Other Topics Concern  . Not on file   Social History Narrative   Lives with spouse   Caffeine - coffee, 3 cups daily     PHYSICAL EXAM  GENERAL EXAM/CONSTITUTIONAL: Vitals:  Vitals:   07/21/16 0937  BP: (!) 148/97  Pulse: 89  Weight: 254 lb (115.2 kg)  Height: 5' 11"  (1.803 m)     Body mass index is 35.43 kg/m.  Visual Acuity Screening   Right eye Left eye Both eyes  Without correction:     With correction: 20/50 20/40      Patient is in no distress; well developed, nourished and groomed; neck is supple  CARDIOVASCULAR:  Examination of carotid arteries is normal; no carotid bruits  Regular rate and rhythm, no murmurs  Examination of peripheral vascular system by  observation and palpation is normal  EYES:  Ophthalmoscopic exam of optic discs and posterior segments is normal; no papilledema or hemorrhages  MUSCULOSKELETAL:  Gait, strength, tone, movements noted in Neurologic exam below  NEUROLOGIC: MENTAL STATUS:  No flowsheet data found.  awake, alert, oriented to person, place and time  recent and remote memory intact  normal attention and concentration  language fluent, comprehension intact, naming intact,   fund of knowledge appropriate  CRANIAL NERVE:   2nd - no papilledema on fundoscopic  exam  2nd, 3rd, 4th, 6th - pupils equal and reactive to light, visual fields full to confrontation, extraocular muscles intact, no nystagmus  5th - facial sensation symmetric  7th - facial strength symmetric  8th - hearing intact  9th - palate elevates symmetrically, uvula midline  11th - shoulder shrug symmetric  12th - tongue protrusion midline  MOTOR:   normal bulk and tone, full strength in the BUE, BLE  SENSORY:   normal and symmetric to light touch, pinprick, temperature, vibration  EXCEPT ABSENT TEMP, VIB AND PP IN FEET  COORDINATION:   finger-nose-finger, fine finger movements normal  REFLEXES:   deep tendon reflexes TRACE and symmetric  ABSENT AT ANKLES  GAIT/STATION:   narrow based gait; DIFF WITH TOE WALKING; ABLE TO WALK ON HEELS; romberg is negative    DIAGNOSTIC DATA (LABS, IMAGING, TESTING) - I reviewed patient records, labs, notes, testing and imaging myself where available.  Lab Results  Component Value Date   WBC 7.0 06/12/2016   HGB 16.3 06/12/2016   HCT 46.4 06/12/2016   MCV 93.4 06/12/2016   PLT 263 06/12/2016      Component Value Date/Time   NA 139 06/12/2016 0809   K 4.2 06/12/2016 0809   CL 103 06/12/2016 0809   CO2 23 06/12/2016 0809   GLUCOSE 107 (H) 06/12/2016 0809   BUN 9 06/12/2016 0809   CREATININE 1.03 06/12/2016 0809   CALCIUM 9.9 06/12/2016 0809   PROT 7.4 06/12/2016  0809   ALBUMIN 4.8 06/12/2016 0809   AST 41 (H) 06/12/2016 0809   ALT 59 (H) 06/12/2016 0809   ALKPHOS 75 06/12/2016 0809   BILITOT 0.6 06/12/2016 0809   GFRNONAA 74 (L) 09/16/2013 0120   GFRNONAA 85 11/03/2012 0934   GFRAA 86 (L) 09/16/2013 0120   GFRAA >89 11/03/2012 0934   Lab Results  Component Value Date   CHOL 223 (H) 06/12/2016   HDL 28 (L) 06/12/2016   LDLCALC 127 (H) 06/12/2016   TRIG 339 (H) 06/12/2016   CHOLHDL 8.0 (H) 06/12/2016   No results found for: HGBA1C No results found for: VITAMINB12 Lab Results  Component Value Date   TSH 1.720 05/17/2013    12/13/15 NCV/EMG (Dr. Posey Pronto) - This is a normal study.  - In particular, there is no evidence of a large fiber sensorimotor polyneuropathy or lumbosacral radiculopathy affecting the lower extremities.    ASSESSMENT AND PLAN  61 y.o. year old male here [place] on treatment since 2013, now with intermittent pain, numbness, in bilateral feet with neurologic examination and skin biopsy suggesting small fiber neuropathy. This could be related to underlying HIV, other metabolic process, or idiopathic. We'll check additional testing to rule out secondary causes of neuropathy. Also may try symptomatic control with neuropathy cream. Patient reports being sensitive to oral medications but we could consider duloxetine or gabapentin in the future.   Meds tried and failed: amitriptyline, lyrica, multivitamin, alpha lipoic acid, folic acid, b-complex   Dx: small fiber neuropathy (due to, metabolic, hormonal, vitamin def, HIV or idiopathic)  1. Idiopathic small fiber peripheral neuropathy       PLAN: - check neuropathy labs to rule out secondary causes - trial of neuropathy cream; if not helpful, then may consider duloxetine or gabapentin  Orders Placed This Encounter  Procedures  . Hemoglobin A1c  . Vitamin B12  . TSH  . ANA w/Reflex if Positive  . Pan-ANCA  . Multiple Myeloma Panel (SPEP&IFE w/QIG)   Return in  about 3  months (around 10/21/2016).    Penni Bombard, MD 0/73/5430, 14:84 AM Certified in Neurology, Neurophysiology and Neuroimaging  [facility] [address] [place], [place] [address] [phone]

## 2016-07-21 NOTE — Telephone Encounter (Signed)
New Rx for neuropathy cream successfully faxed to [facility].  Baclofen 2%, Bupivacaine 1%, Diclofenac 3%, Gabapentin 6% ; 240 Gm, apply 1-2 Gm to affected area 3-4 x daily

## 2016-07-24 ENCOUNTER — Telehealth: Payer: Self-pay | Admitting: *Deleted

## 2016-07-24 LAB — PAN-ANCA
ANCA Proteinase 3: <3.5 U/mL (ref 0.0–3.5)
C-ANCA: <1:20 {titer}
P-ANCA: <1:20 {titer}

## 2016-07-24 LAB — HEMOGLOBIN A1C
ESTIMATED AVERAGE GLUCOSE: 120 mg/dL
Hgb A1c MFr Bld: 5.8 % — ABNORMAL HIGH (ref 4.8–5.6)

## 2016-07-24 LAB — VITAMIN B12: VITAMIN B 12: 543 pg/mL (ref 232–1245)

## 2016-07-24 LAB — MULTIPLE MYELOMA PANEL, SERUM
ALBUMIN SERPL ELPH-MCNC: 4.2 g/dL (ref 2.9–4.4)
ALPHA 1: 0.2 g/dL (ref 0.0–0.4)
ALPHA2 GLOB SERPL ELPH-MCNC: 0.7 g/dL (ref 0.4–1.0)
Albumin/Glob SerPl: 1.3 (ref 0.7–1.7)
B-Globulin SerPl Elph-Mcnc: 1.3 g/dL (ref 0.7–1.3)
Gamma Glob SerPl Elph-Mcnc: 1.1 g/dL (ref 0.4–1.8)
Globulin, Total: 3.4 g/dL (ref 2.2–3.9)
IGA/IMMUNOGLOBULIN A, SERUM: 172 mg/dL (ref 90–386)
IGG (IMMUNOGLOBIN G), SERUM: 1005 mg/dL (ref 700–1600)
IGM (IMMUNOGLOBULIN M), SRM: 127 mg/dL (ref 20–172)
TOTAL PROTEIN: 7.6 g/dL (ref 6.0–8.5)

## 2016-07-24 LAB — ANA W/REFLEX IF POSITIVE: Anti Nuclear Antibody(ANA): NEGATIVE

## 2016-07-24 LAB — TSH: TSH: 1.18 u[IU]/mL (ref 0.450–4.500)

## 2016-07-24 NOTE — Telephone Encounter (Signed)
Spoke with patient and informed him his lab results are unremarkable. He stated he received neuropathy cream today. This RN requested he use for a week and call back to update whether it is helping. Advised if it is not, Dr Leta Baptist will consider other medications. Patient verbalized understanding, appreciation for call.

## 2016-08-05 ENCOUNTER — Other Ambulatory Visit: Payer: Self-pay | Admitting: Family Medicine

## 2016-08-05 NOTE — Telephone Encounter (Signed)
Have him come in for an appointment.  

## 2016-08-05 NOTE — Telephone Encounter (Signed)
;  left message word for word

## 2016-08-05 NOTE — Telephone Encounter (Signed)
Is this okay to refill? 

## 2016-08-06 NOTE — Telephone Encounter (Signed)
We will switch him when he comes in

## 2016-08-06 NOTE — Telephone Encounter (Signed)
PT HAS APPOINTMENT 08/11/16 WHAT DO YOU WANT TO DO WITH THESE REFILLS

## 2016-08-11 ENCOUNTER — Ambulatory Visit (INDEPENDENT_AMBULATORY_CARE_PROVIDER_SITE_OTHER): Payer: Federal, State, Local not specified - PPO | Admitting: Family Medicine

## 2016-08-11 ENCOUNTER — Encounter: Payer: Self-pay | Admitting: Family Medicine

## 2016-08-11 VITALS — BP 120/80 | HR 95 | Ht 71.0 in | Wt 253.0 lb

## 2016-08-11 DIAGNOSIS — M7581 Other shoulder lesions, right shoulder: Secondary | ICD-10-CM | POA: Diagnosis not present

## 2016-08-11 DIAGNOSIS — G609 Hereditary and idiopathic neuropathy, unspecified: Secondary | ICD-10-CM | POA: Diagnosis not present

## 2016-08-11 DIAGNOSIS — B20 Human immunodeficiency virus [HIV] disease: Secondary | ICD-10-CM | POA: Diagnosis not present

## 2016-08-11 MED ORDER — LIDOCAINE HCL 2 % IJ SOLN
3.0000 mL | Freq: Once | INTRAMUSCULAR | Status: AC
Start: 2016-08-11 — End: 2016-08-11
  Administered 2016-08-11: 60 mg via INTRADERMAL

## 2016-08-11 MED ORDER — DULOXETINE HCL 30 MG PO CPEP: 30.0000 mg | ORAL_CAPSULE | Freq: Every day | ORAL | 3 refills | Status: DC

## 2016-08-11 MED ORDER — TRIAMCINOLONE ACETONIDE 40 MG/ML IJ SUSP
40.0000 mg | Freq: Once | INTRAMUSCULAR | Status: AC
  Administered 2016-08-11: 40 mg via INTRAMUSCULAR

## 2016-08-11 NOTE — Progress Notes (Signed)
   Subjective:    Patient ID: Joseph Johnston, male    DOB: [birth year], 61 y.o.   MRN: [id number]  HPI He is here for consultation. He continues on his HIV meds and states that he has enough medications on hand to last until March. At that point I will then switch him to one of the newer HIV medications. He also has an underlying history of peripheral neuropathy. In the past he has used Lyrica as well as amitriptyline. Recently he did try topical creams, all of which have not been beneficial. I did review the note from the neurologist. He did recommend possibly trying Cymbalta or gabapentin. Also approximately 2 weeks ago he did a lot of right arm use causing right shoulder pain especially with abduction and external rotation. No numbness, tingling or weakness.  Review of Systems     Objective:   Physical Exam Alert and in no distress. Pain on motion of the shoulder but fairly good motion. Drop arm test was uncomfortable. Empty can test again was painful. Neer's and Hawkins tests uncomfortable. No sulcus.       Assessment & Plan:  Idiopathic small fiber peripheral neuropathy - Plan: DULoxetine (CYMBALTA) 30 MG capsule  Human immunodeficiency virus (HIV) disease ([place])  Rotator cuff tendinitis, right I discussed the treatment of the neuropathy with him. I will start him on Cymbalta 30 mg and increase this to 60 if needed. He will let me know within the next several weeks. We will continue him on his present HIV meds until his prescriptions run out and I will switch him to a different medication. I also discussed treatment of the bursitis. Discussed anti-inflammatory medicine versus injection. He has had an injection in the past and would like to do that. The right shoulder was prepped with Betadine. 40 mg of Kenalog and 3 mL of Xylocaine was injected into the subacromial bursa without difficulty. He tolerated the procedure well and did note slight improvement in his symptoms fairly quickly.

## 2016-08-11 NOTE — Addendum Note (Signed)
Addended by: Randel Books on: 08/11/2016 01:23 PM   Modules accepted: Orders

## 2016-08-14 ENCOUNTER — Other Ambulatory Visit: Payer: Self-pay | Admitting: Family Medicine

## 2016-08-14 NOTE — Telephone Encounter (Signed)
Is this okay to refill? 

## 2016-09-02 ENCOUNTER — Telehealth: Payer: Self-pay | Admitting: Family Medicine

## 2016-09-02 MED ORDER — DOLUTEGRAVIR SODIUM 50 MG PO TABS: 50.0000 mg | ORAL_TABLET | Freq: Every day | ORAL | 0 refills | Status: AC

## 2016-09-02 MED ORDER — EMTRICITABINE-TENOFOVIR DF 200-300 MG PO TABS: 1.0000 | ORAL_TABLET | Freq: Every day | ORAL | 0 refills | Status: AC

## 2016-09-02 NOTE — Telephone Encounter (Signed)
Pt needs refills Tivicay and Truvada 90 days each to [facility]

## 2016-10-20 ENCOUNTER — Other Ambulatory Visit (INDEPENDENT_AMBULATORY_CARE_PROVIDER_SITE_OTHER): Payer: Federal, State, Local not specified - PPO

## 2016-10-20 ENCOUNTER — Telehealth: Payer: Self-pay | Admitting: Family Medicine

## 2016-10-20 DIAGNOSIS — Z23 Encounter for immunization: Secondary | ICD-10-CM

## 2016-10-20 NOTE — Telephone Encounter (Signed)
Pt wants to know if he can come in for lab work states he is suppose to come in every 6 months for lab work I didn't see any orders for them please advise pt can be reached at [phone]

## 2016-10-21 ENCOUNTER — Ambulatory Visit (INDEPENDENT_AMBULATORY_CARE_PROVIDER_SITE_OTHER): Payer: Federal, State, Local not specified - PPO | Admitting: Family Medicine

## 2016-10-21 ENCOUNTER — Encounter: Payer: Self-pay | Admitting: Family Medicine

## 2016-10-21 VITALS — BP 120/80 | HR 80 | Wt 239.6 lb

## 2016-10-21 DIAGNOSIS — Z79899 Other long term (current) drug therapy: Secondary | ICD-10-CM | POA: Diagnosis not present

## 2016-10-21 DIAGNOSIS — G609 Hereditary and idiopathic neuropathy, unspecified: Secondary | ICD-10-CM | POA: Diagnosis not present

## 2016-10-21 DIAGNOSIS — B20 Human immunodeficiency virus [HIV] disease: Secondary | ICD-10-CM | POA: Diagnosis not present

## 2016-10-21 MED ORDER — DULOXETINE HCL 60 MG PO CPEP: 60.0000 mg | ORAL_CAPSULE | Freq: Every day | ORAL | 3 refills | Status: AC

## 2016-10-21 NOTE — Progress Notes (Signed)
   Subjective:    Patient ID: Joseph Johnston, male    DOB: 1955/12/07, 61 y.o.   MRN: [id number]  HPI He is here for an interval evaluation. He is in the process of moving to [facility] [place] the [place]. He continues to do quite nicely on his HIV medications. He states that the present dosing of the Cymbalta has not helped with his neuropathy. Still complains of tingling sensation in his feet. He's had no other difficulty with fever, chills, weight change, chest pain, shortness of breath. He states that he does have enough HIV medication to last until March.   Review of Systems     Objective:   Physical Exam Alert and in no distress. Tympanic membranes and canals are normal. Pharyngeal area is normal. Neck is supple without adenopathy or thyromegaly. Cardiac exam shows a regular sinus rhythm without murmurs or gallops. Lungs are clear to auscultation.        Assessment & Plan:  Human immunodeficiency virus (HIV) disease ([place]) - Plan: CBC with Differential/Platelet, Comprehensive metabolic panel, Lipid panel, HIV 1 RNA quant-no reflex-bld, T-helper cells (CD4) count (not at [facility])  Idiopathic small fiber peripheral neuropathy - Plan: DULoxetine (CYMBALTA) 60 MG capsule  Encounter for long-term (current) use of high-risk medication - Plan: CBC with Differential/Platelet, Comprehensive metabolic panel, Lipid panel, HIV 1 RNA quant-no reflex-bld, T-helper cells (CD4) count (not at [facility]) I will increase his Cymbalta to see if if this will help with his neuropathy. I will do routine blood screening on him. Did discuss the possibility of switching him to [place]. Recommended he find an HIV primary care doctor in his areahelp with his care however I willable to care for him if we need to.

## 2016-10-22 LAB — CBC WITH DIFFERENTIAL/PLATELET
Basophils Absolute: 29 cells/uL (ref 0–200)
Basophils Relative: 0.5 %
Eosinophils Absolute: 40 cells/uL (ref 15–500)
Eosinophils Relative: 0.7 %
HCT: 43.8 % (ref 38.5–50.0)
Hemoglobin: 15.6 g/dL (ref 13.2–17.1)
Lymphs Abs: 2668 cells/uL (ref 850–3900)
MCH: 32.8 pg (ref 27.0–33.0)
MCHC: 35.6 g/dL (ref 32.0–36.0)
MCV: 92.2 fL (ref 80.0–100.0)
MPV: 9.4 fL (ref 7.5–12.5)
Monocytes Relative: 6 %
Neutro Abs: 2622 cells/uL (ref 1500–7800)
Neutrophils Relative %: 46 %
Platelets: 311 10*3/uL (ref 140–400)
RBC: 4.75 10*6/uL (ref 4.20–5.80)
RDW: 13 % (ref 11.0–15.0)
Total Lymphocyte: 46.8 %
WBC mixed population: 342 cells/uL (ref 200–950)
WBC: 5.7 10*3/uL (ref 3.8–10.8)

## 2016-10-22 LAB — COMPREHENSIVE METABOLIC PANEL
AG RATIO: 2 (calc) (ref 1.0–2.5)
ALT: 35 U/L (ref 9–46)
AST: 30 U/L (ref 10–35)
Albumin: 4.6 g/dL (ref 3.6–5.1)
Alkaline phosphatase (APISO): 73 U/L (ref 40–115)
BILIRUBIN TOTAL: 0.7 mg/dL (ref 0.2–1.2)
BUN: 10 mg/dL (ref 7–25)
CALCIUM: 9.4 mg/dL (ref 8.6–10.3)
CO2: 24 mmol/L (ref 20–32)
Chloride: 105 mmol/L (ref 98–110)
Creat: 0.95 mg/dL (ref 0.70–1.33)
GLUCOSE: 99 mg/dL (ref 65–99)
Globulin: 2.3 g/dL (calc) (ref 1.9–3.7)
Potassium: 4.3 mmol/L (ref 3.5–5.3)
Sodium: 139 mmol/L (ref 135–146)
Total Protein: 6.9 g/dL (ref 6.1–8.1)

## 2016-10-22 LAB — LIPID PANEL
Cholesterol: 215 mg/dL — ABNORMAL HIGH (ref <200)
HDL: 32 mg/dL — AB (ref >40)
LDL CHOLESTEROL (CALC): 150 mg/dL — AB
Non-HDL Cholesterol (Calc): 183 mg/dL (calc) — ABNORMAL HIGH (ref <130)
TRIGLYCERIDES: 192 mg/dL — AB (ref <150)
Total CHOL/HDL Ratio: 6.7 (calc) — ABNORMAL HIGH (ref <5.0)

## 2016-10-22 LAB — T-HELPER CELLS (CD4) COUNT (NOT AT ARMC)
ABSOLUTE CD4: 1833 {cells}/uL — AB (ref 490–1740)
CD4 T Helper %: 46 % (ref 30–61)
TOTAL LYMPHOCYTE COUNT: 3954 {cells}/uL — AB (ref 850–3900)

## 2016-10-23 LAB — HIV-1 RNA QUANT-NO REFLEX-BLD
HIV 1 RNA QUANT: DETECTED {copies}/mL — AB
HIV-1 RNA Quant, Log: <1.3 Log copies/mL — AB

## 2016-11-03 ENCOUNTER — Ambulatory Visit: Payer: Federal, State, Local not specified - PPO | Admitting: Diagnostic Neuroimaging

## 2016-12-16 ENCOUNTER — Encounter: Payer: Self-pay | Admitting: Family Medicine

## 2017-08-14 IMAGING — CR DG CHEST 2V
2 series · 2 of 2 positions shown · non-contrast
Comparison: 04/20/2014

CLINICAL DATA: Chest pain short of breath

EXAM:
CHEST  2 VIEW

[w chest pa]
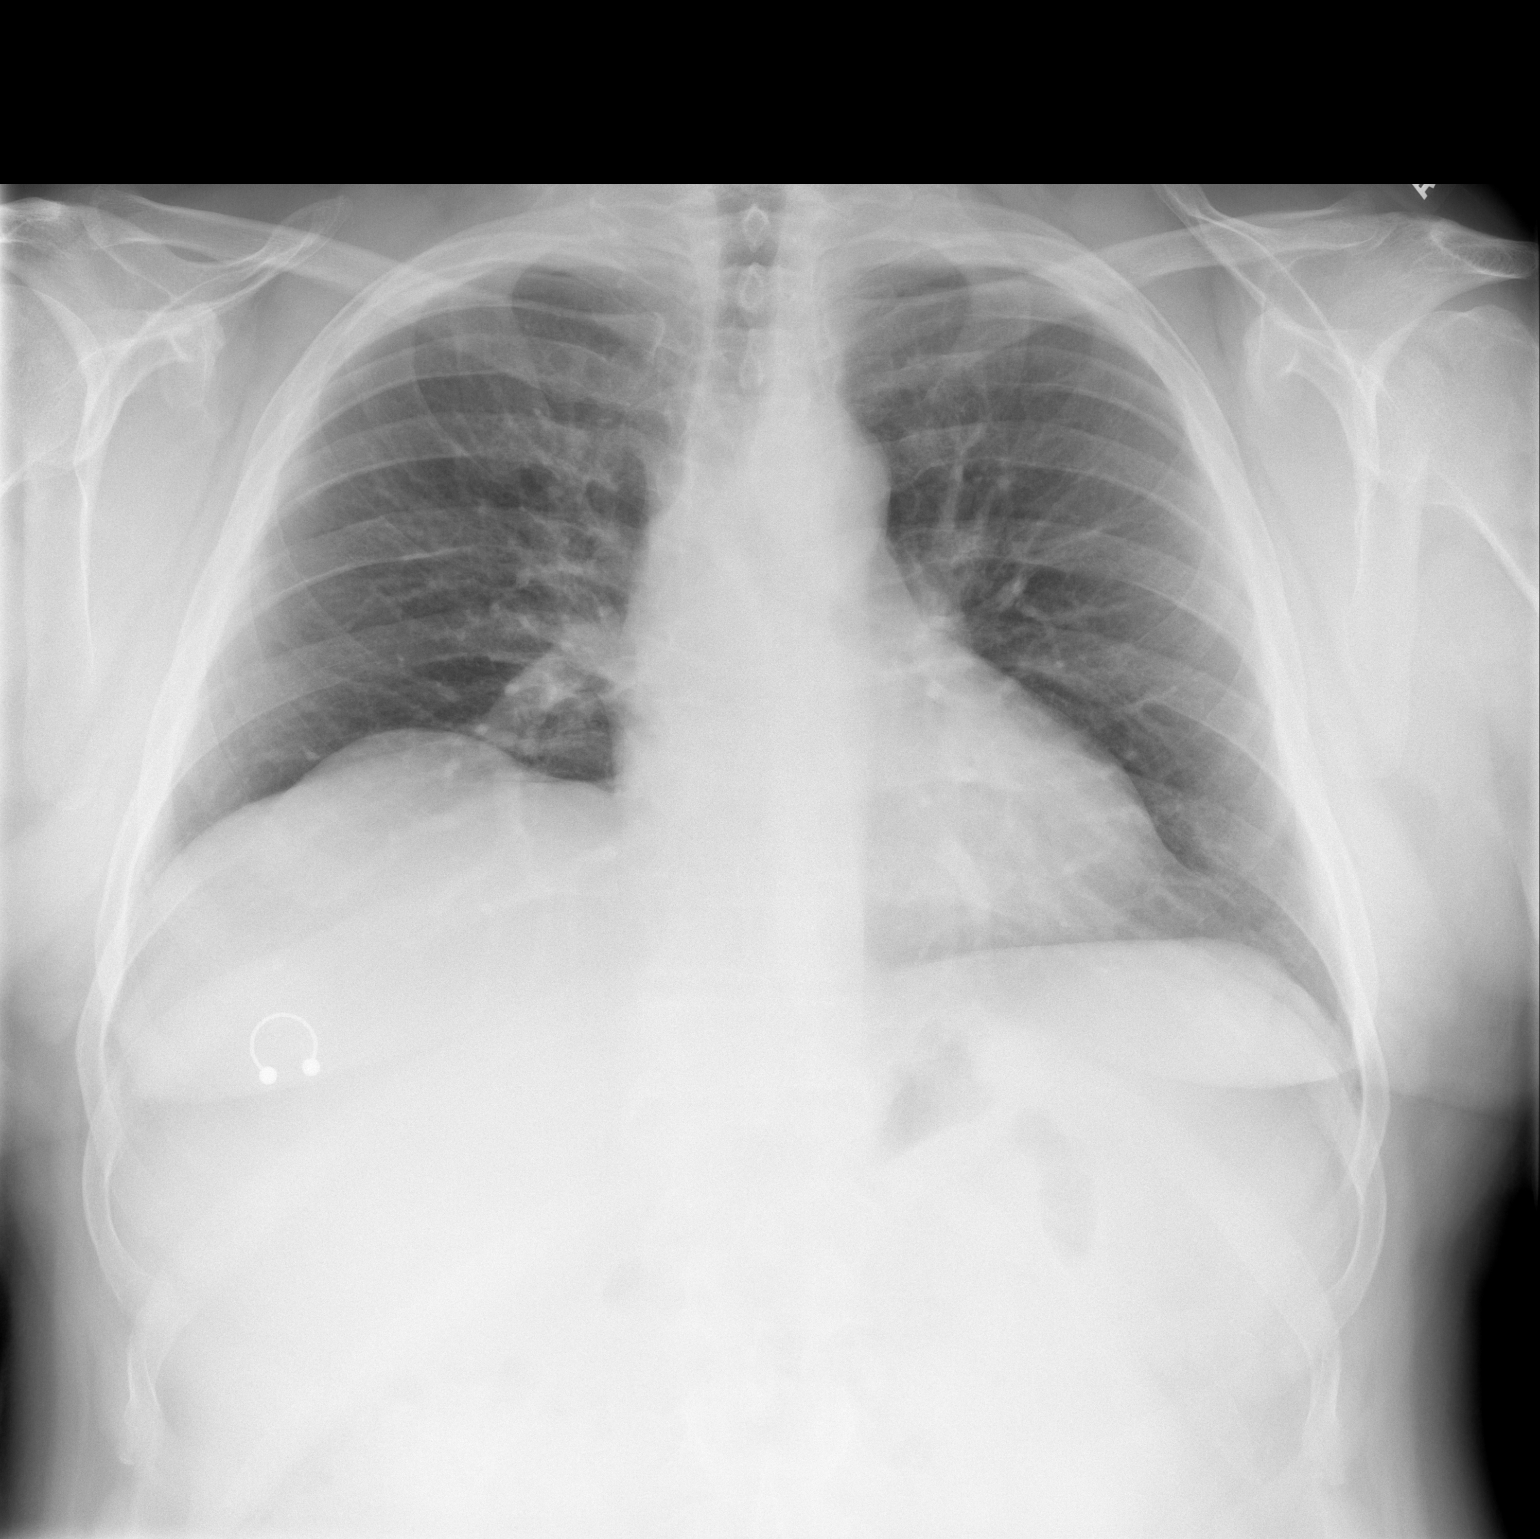

[w chest lat]
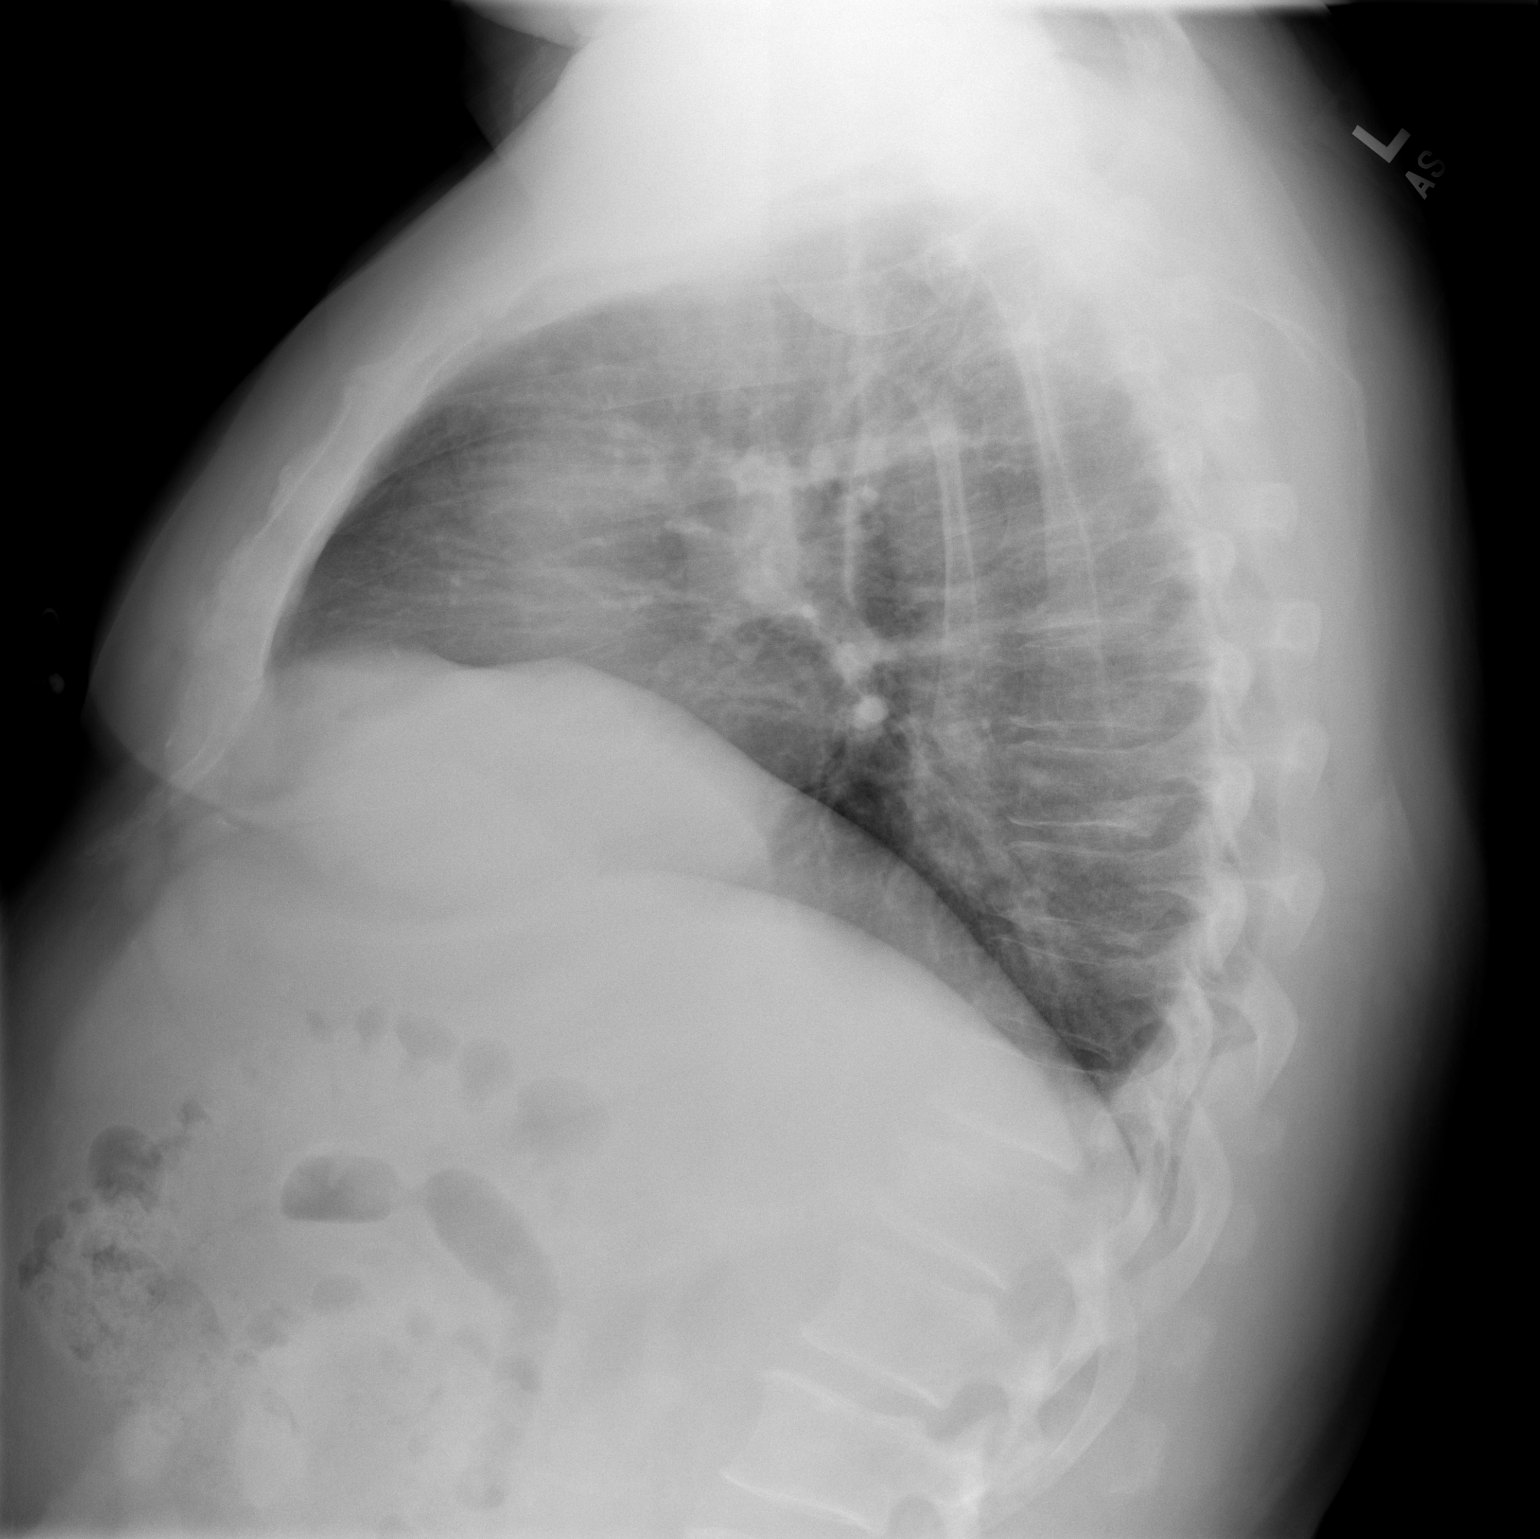

[2 of 2 positions shown; findings below may reference images not displayed]

FINDINGS: Heart size within normal limits. Elevated right hemidiaphragm
unchanged. Negative for heart failure. Lungs are clear without
infiltrate effusion or mass.
IMPRESSION: Chronic elevation right hemidiaphragm. No superimposed acute
abnormality.
